# Patient Record
Sex: Male | Born: 1960
Health system: Southern US, Community
[De-identification: ages and names within clinical notes are randomized; demographics above are authoritative.]

## PROBLEM LIST (undated history)

## (undated) DIAGNOSIS — I10 Essential (primary) hypertension: Secondary | ICD-10-CM

## (undated) DIAGNOSIS — R066 Hiccough: Secondary | ICD-10-CM

## (undated) HISTORY — PX: OTHER SURGICAL HISTORY: SHX169

---

## 2005-05-29 ENCOUNTER — Ambulatory Visit (HOSPITAL_COMMUNITY): Admission: RE | Admit: 2005-05-29 | Discharge: 2005-05-29 | Payer: Self-pay | Admitting: Family Medicine

## 2005-10-10 ENCOUNTER — Emergency Department (HOSPITAL_COMMUNITY): Admission: EM | Admit: 2005-10-10 | Discharge: 2005-10-10 | Payer: Self-pay | Admitting: Emergency Medicine

## 2008-10-29 DIAGNOSIS — C4491 Basal cell carcinoma of skin, unspecified: Secondary | ICD-10-CM

## 2008-10-29 HISTORY — DX: Basal cell carcinoma of skin, unspecified: C44.91

## 2008-11-21 DIAGNOSIS — C4492 Squamous cell carcinoma of skin, unspecified: Secondary | ICD-10-CM

## 2008-11-21 HISTORY — DX: Squamous cell carcinoma of skin, unspecified: C44.92

## 2009-04-24 ENCOUNTER — Ambulatory Visit (HOSPITAL_COMMUNITY): Admission: RE | Admit: 2009-04-24 | Discharge: 2009-04-24 | Payer: Self-pay | Admitting: Family Medicine

## 2010-08-12 ENCOUNTER — Ambulatory Visit (HOSPITAL_COMMUNITY): Admission: RE | Admit: 2010-08-12 | Discharge: 2010-08-12 | Payer: Self-pay | Admitting: Family Medicine

## 2012-01-03 ENCOUNTER — Emergency Department (HOSPITAL_COMMUNITY)
Admission: EM | Admit: 2012-01-03 | Discharge: 2012-01-03 | Disposition: A | Discharge status: Home / Self Care | Payer: BC Managed Care – PPO | Attending: Emergency Medicine | Admitting: Emergency Medicine

## 2012-01-03 ENCOUNTER — Encounter (HOSPITAL_COMMUNITY): Payer: Self-pay | Admitting: *Deleted

## 2012-01-03 DIAGNOSIS — R066 Hiccough: Secondary | ICD-10-CM | POA: Insufficient documentation

## 2012-01-03 DIAGNOSIS — I10 Essential (primary) hypertension: Secondary | ICD-10-CM | POA: Insufficient documentation

## 2012-01-03 HISTORY — DX: Essential (primary) hypertension: I10

## 2012-01-03 MED ORDER — CHLORPROMAZINE HCL 25 MG/ML IJ SOLN
25.0000 mg | Freq: Once | INTRAMUSCULAR | Status: AC
  Administered 2012-01-03: 25 mg via INTRAMUSCULAR
  Filled 2012-01-03: qty 2

## 2012-01-03 MED ORDER — CHLORPROMAZINE HCL 25 MG PO TABS: 25.0000 mg | ORAL_TABLET | Freq: Three times a day (TID) | ORAL | Status: DC

## 2012-01-03 NOTE — ED Provider Notes (Signed)
History     CSN: [id number]  Arrival date & time 01/03/12  0121   First MD Initiated Contact with Patient 01/03/12 [phone]      Chief Complaint  Patient presents with  . Hiccups    (Consider location/radiation/quality/duration/timing/severity/associated sxs/prior treatment) HPI Comments: Hiccups for 3 days and not slowing.  Has had this problem before.  Was given a shot here which helped.  He has thorazine from 2009 which he kept in the freezer but is not helping.  No cough or shortness of breath.  No chest pain.  No other complaints.    The history is provided by the patient.    Past Medical History  Diagnosis Date  . Hypertension     History reviewed. No pertinent past surgical history.  History reviewed. No pertinent family history.  History  Substance Use Topics  . Smoking status: Not on file  . Smokeless tobacco: Not on file  . Alcohol Use:       Review of Systems  All other systems reviewed and are negative.    Allergies  Review of patient's allergies indicates not on file.  Home Medications   Current Outpatient Rx  Name Route Sig Dispense Refill  . CHLORAL HYDRATE 500 MG PO CAPS Oral Take 500 mg by mouth at bedtime as needed.    . ENALAPRIL MALEATE 10 MG PO TABS Oral Take 10 mg by mouth daily.    Marland Kitchen PRAVASTATIN SODIUM 40 MG PO TABS Oral Take 40 mg by mouth daily.      BP 153/99  Pulse 88  Temp(Src) 99.4 F (37.4 C) (Oral)  Resp 20  Ht 5\' 10"  (1.778 m)  Wt 208 lb (94.348 kg)  BMI 29.84 kg/m2  SpO2 96%  Physical Exam  Nursing note and vitals reviewed. Constitutional: He is oriented to person, place, and time. He appears well-developed and well-nourished. No distress.  HENT:  Head: Normocephalic and atraumatic.  Neck: Normal range of motion. Neck supple.  Cardiovascular: Normal rate.   No murmur heard. Pulmonary/Chest: Effort normal and breath sounds normal. No respiratory distress. He has no wheezes. He has no rales.  Abdominal: Soft. Bowel  sounds are normal.  Musculoskeletal: Normal range of motion.  Neurological: He is alert and oriented to person, place, and time.  Skin: Skin is warm and dry. He is not diaphoretic.    ED Course  Procedures (including critical care time)  Labs Reviewed - No data to display No results found.   No diagnosis found.    MDM  Feels better with thorazine.        Geoffery Lyons, MD 01/03/12 0300

## 2012-01-03 NOTE — Discharge Instructions (Signed)
Hiccups Hiccups are caused by a sudden contraction of the muscles between the ribs and the muscle under your lungs (diaphragm). When you hiccup, the top of your windpipe (glottis) closes immediately after your diaphragm contracts. This makes the typical 'hic' sound. A hiccup is a reflex that you cannot stop. Unlike other reflexes such as coughing and sneezing, hiccups do not seem to have any useful purpose. There are 3 types of hiccups:   Benign bouts: last less than 48 hours.   Persistent: last more than 48 hours but less than 1 month.   Intractable: last more than 1 month.  CAUSES  Most people have bouts of hiccups from time to time. They start for no apparent reason, last a short while, and then stop. Sometimes they are due to:  A temporary swollen stomach caused by overeating or eating too fast, eating spicy foods, drinking fizzy drinks, or swallowing air.   A sudden change in temperature (very hot or cold foods or drinks, a cold shower).   Drinking alcohol or using tobacco.  There are no particular tests used to diagnose hiccups. Hiccups are usually considered harmless and do not point to a serious medical condition. However, there can be underlying medical problems that may cause hiccups, such as pneumonia, diabetes, metabolic problems, tumors, abdominal infections or injuries, and neurologic problems.You must follow up with your caregiver if your symptoms persist or become a frequent problem. TREATMENT   Most cases need no treatment. A bout of benign hiccups usually does not last long.   Medicine is sometimes needed to stop persistent hiccups. Medicine may be given intravenously (IV) or by mouth.   Hypnosis or acupuncture may be suggested.   Surgery to affect the nerve that supplies the diaphragm may be tried in severe cases.   Treatment of an underlying cause is needed in some cases.  HOME CARE INSTRUCTIONS  Popular remedies that may stop a bout of hiccups include:  Gargling  ice water.   Swallowing granulated sugar.   Biting on a lemon.   Holding your breath, breathing fast, or breathing into a paper bag.   Bearing down.   Gasping after a sudden fright.   Pulling your tongue gently.   Distraction.  SEEK MEDICAL CARE IF:   Hiccups last for more than 48 hours.   You are given medicine but your hiccups do not get better.   New symptoms show up.   You cannot sleep or eat due to the hiccups.   You have unexpected weight loss.   You have trouble breathing or swallowing.   You develop severe pain in your abdomen or other areas.   You develop numbness, tingling, or weakness.  Document Released: 11/23/2001 Document Revised: 09/03/2011 Document Reviewed: 11/05/2010 [facility] Patient Information 2012 [place], [place].

## 2012-03-03 DIAGNOSIS — C4491 Basal cell carcinoma of skin, unspecified: Secondary | ICD-10-CM

## 2012-03-03 HISTORY — DX: Basal cell carcinoma of skin, unspecified: C44.91

## 2012-07-06 DIAGNOSIS — C4491 Basal cell carcinoma of skin, unspecified: Secondary | ICD-10-CM

## 2012-07-06 HISTORY — DX: Basal cell carcinoma of skin, unspecified: C44.91

## 2014-01-25 ENCOUNTER — Emergency Department (HOSPITAL_COMMUNITY): Payer: BC Managed Care – PPO

## 2014-01-25 ENCOUNTER — Emergency Department (HOSPITAL_COMMUNITY)
Admission: EM | Admit: 2014-01-25 | Discharge: 2014-01-26 | Disposition: A | Discharge status: Home / Self Care | Payer: BC Managed Care – PPO | Attending: Emergency Medicine | Admitting: Emergency Medicine

## 2014-01-25 ENCOUNTER — Encounter (HOSPITAL_COMMUNITY): Payer: Self-pay | Admitting: Emergency Medicine

## 2014-01-25 DIAGNOSIS — J029 Acute pharyngitis, unspecified: Secondary | ICD-10-CM | POA: Insufficient documentation

## 2014-01-25 DIAGNOSIS — I10 Essential (primary) hypertension: Secondary | ICD-10-CM

## 2014-01-25 DIAGNOSIS — J209 Acute bronchitis, unspecified: Secondary | ICD-10-CM | POA: Insufficient documentation

## 2014-01-25 DIAGNOSIS — J40 Bronchitis, not specified as acute or chronic: Secondary | ICD-10-CM

## 2014-01-25 NOTE — Discharge Instructions (Signed)
Drink plenty of fluids. Take the antibiotic until gone. Start taking the blood pressure medications again. Try to get a primary care doctor so you can stay on your blood pressure medication. Return if you feel worse such as fever, headache, chest pain, shortness of breath.

## 2014-01-25 NOTE — ED Provider Notes (Signed)
CSN: [id number]     Arrival date & time 01/25/14  2101 History  This chart was scribed for Janice Norrie, MD by Ladene Artist, ED Scribe. The patient was seen in room APA08/APA08. Patient's care was started at 10:40 PM.    Chief Complaint  Patient presents with  . Hypertension  . Sore Throat    Patient is a 53 y.o. male presenting with pharyngitis. The history is provided by the patient. No language interpreter was used.  Sore Throat Pertinent negatives include no chest pain, no headaches and no shortness of breath.   HPI Comments: Ethan Walls is a 53 y.o. male who presents to the Emergency Department complaining of sore throat for 1.5-2 weeks. Pt states that he has been having cold-like symptoms for a about 3  weeks. He reports associated mildly productive cough with yellow-green sputum and spasms in his jaw that moves from side to side.. Pt reports diffuse teeth pain which is alleviated with biting down. He also states that the roof of his mouth has felt ike sand paper since Monday, 3 days ago. He also reports congestion, neck soreness and sinus pressure every 15 minutes. He denies fever, wheezing, SOB, chest pain, HA, nausea, vomiting, diarrhea, rhinorrhea, and sneezing. Pt used Chloraseptic spray and lozenge Tuesday with improvement of his sore throat.   No sick contacts.  Pt is a nonsmoker. His parent's were smokers.   Pt worked 12 hours today.   PCP Karie Kirks; last appointment was 06/2013 but he was not seen since he left due to long wait.    Past Medical History  Diagnosis Date  . Hypertension    History reviewed. No pertinent past surgical history. No family history on file. History  Substance Use Topics  . Smoking status: Never Smoker   . Smokeless tobacco: Not on file  . Alcohol Use: No  employed Lives at home Lives with spouse FOP used to smoke  Review of Systems  Constitutional: Negative for fever.  HENT: Positive for congestion, dental problem (teeth pain)  and sinus pressure. Negative for rhinorrhea and sneezing.   Respiratory: Positive for cough. Negative for shortness of breath and wheezing.   Cardiovascular: Negative for chest pain.  Gastrointestinal: Negative for nausea, vomiting and diarrhea.  Neurological: Negative for headaches.  All other systems reviewed and are negative.   Allergies  Review of patient's allergies indicates no known allergies.  Home Medications   Pt states that he used to take 10mg  enalapril, a cholesterol pill and ASA 81 mg daily.  Triage Vitals: BP 187/104  Pulse 71  Temp(Src) 98.1 F (36.7 C) (Oral)  Resp 18  SpO2 98%  Vital signs normal except hypertension  Physical Exam  Nursing note and vitals reviewed. Constitutional: He is oriented to person, place, and time. He appears well-developed and well-nourished.  Non-toxic appearance. He does not appear ill. No distress.  HENT:  Head: Normocephalic and atraumatic.  Right Ear: External ear normal.  Left Ear: External ear normal.  Nose: Nose normal. No mucosal edema or rhinorrhea.  Mouth/Throat: Oropharynx is clear and moist and mucous membranes are normal. No dental abscesses or uvula swelling.  Percussed pt's teeth, nontender  Eyes: Conjunctivae and EOM are normal. Pupils are equal, round, and reactive to light.  Neck: Normal range of motion and full passive range of motion without pain. Neck supple.  Cardiovascular: Normal rate, regular rhythm and normal heart sounds.  Exam reveals no gallop and no friction rub.   No murmur heard.  Pulmonary/Chest: Effort normal and breath sounds normal. No respiratory distress. He has no wheezes. He has no rhonchi. He has no rales. He exhibits no tenderness and no crepitus.  Lungs slightly diminished   Abdominal: Soft. Normal appearance and bowel sounds are normal. He exhibits no distension. There is no tenderness. There is no rebound and no guarding.  Musculoskeletal: Normal range of motion. He exhibits no edema and  no tenderness.  Moves all extremities well.   Neurological: He is alert and oriented to person, place, and time. He has normal strength. No cranial nerve deficit.  Skin: Skin is warm, dry and intact. No rash noted. No erythema. No pallor.  Psychiatric: He has a normal mood and affect. His speech is normal and behavior is normal. His mood appears not anxious.    ED Course  Procedures (including critical care time) DIAGNOSTIC STUDIES: Oxygen Saturation is 98% on RA, normal by my interpretation.    COORDINATION OF CARE: 10:51 PM-Discussed treatment plan which includes CXR with pt at bedside and pt agreed to plan.   Labs Review Labs Reviewed - No data to display  Imaging Review Dg Chest 2 View  01/26/2014   CLINICAL DATA:  Cough and sore throat  EXAM: CHEST  2 VIEW  COMPARISON:  Prior radiograph from 08/12/2010  FINDINGS: The cardiac and mediastinal silhouettes are stable in size and contour, and remain within normal limits.  The lungs are normally inflated. No airspace consolidation, pleural effusion, or pulmonary edema is identified. There is no pneumothorax.  No acute osseous abnormality identified.  IMPRESSION: No acute cardiopulmonary abnormality identified.   Electronically Signed   By: Jeannine Boga M.D.   On: 01/26/2014 00:14     EKG Interpretation None      MDM   Final diagnoses:  Bronchitis  Sore throat  Hypertension   New Prescriptions   AMOXICILLIN (AMOXIL) 500 MG CAPSULE    Take 1 capsule (500 mg total) by mouth 3 (three) times daily.   ENALAPRIL (VASOTEC) 10 MG TABLET    Take 0.5 tablets (5 mg total) by mouth daily.    Plan discharge   Rolland Porter, MD, FACEP   I personally performed the services described in this documentation, which was scribed in my presence. The recorded information has been reviewed and considered.  Rolland Porter, MD, Abram Sander    Janice Norrie, MD 01/26/14 [phone]

## 2014-01-25 NOTE — ED Notes (Signed)
Pt reports he went to urgent care for cold symptoms, and was sent to the ED due to high BP 204/132. Pt c/o sore throat, mouth. Reports coughing "some phlegm up but it's not moving".

## 2014-01-25 NOTE — ED Notes (Signed)
Pt reports "i had a falling out with my doctor about a year ago and I haven't gotten my medications refilled since then."

## 2014-01-26 MED ORDER — ENALAPRIL MALEATE 10 MG PO TABS: 5.0000 mg | ORAL_TABLET | Freq: Every day | ORAL | Status: DC

## 2014-01-26 MED ORDER — AMOXICILLIN 500 MG PO CAPS: 500.0000 mg | ORAL_CAPSULE | Freq: Three times a day (TID) | ORAL | Status: DC

## 2014-02-15 ENCOUNTER — Encounter: Payer: Self-pay | Admitting: Family Medicine

## 2014-02-15 ENCOUNTER — Ambulatory Visit (INDEPENDENT_AMBULATORY_CARE_PROVIDER_SITE_OTHER): Payer: BC Managed Care – PPO | Admitting: Family Medicine

## 2014-02-15 VITALS — BP 158/94 | Ht 70.0 in | Wt 212.0 lb

## 2014-02-15 DIAGNOSIS — I10 Essential (primary) hypertension: Secondary | ICD-10-CM

## 2014-02-15 DIAGNOSIS — Z13220 Encounter for screening for lipoid disorders: Secondary | ICD-10-CM

## 2014-02-15 DIAGNOSIS — Z79899 Other long term (current) drug therapy: Secondary | ICD-10-CM

## 2014-02-15 DIAGNOSIS — Z125 Encounter for screening for malignant neoplasm of prostate: Secondary | ICD-10-CM

## 2014-02-15 DIAGNOSIS — E785 Hyperlipidemia, unspecified: Secondary | ICD-10-CM

## 2014-02-15 LAB — HEPATIC FUNCTION PANEL
ALBUMIN: 4.4 g/dL (ref 3.5–5.2)
ALT: 33 U/L (ref 0–53)
AST: 26 U/L (ref 0–37)
Alkaline Phosphatase: 71 U/L (ref 39–117)
Bilirubin, Direct: 0.1 mg/dL (ref 0.0–0.3)
Indirect Bilirubin: 0.6 mg/dL (ref 0.2–1.2)
TOTAL PROTEIN: 7.3 g/dL (ref 6.0–8.3)
Total Bilirubin: 0.7 mg/dL (ref 0.2–1.2)

## 2014-02-15 LAB — LIPID PANEL
CHOL/HDL RATIO: 4.4 ratio
Cholesterol: 191 mg/dL (ref 0–200)
HDL: 43 mg/dL (ref >39)
LDL CALC: 127 mg/dL — AB (ref 0–99)
TRIGLYCERIDES: 106 mg/dL (ref <150)
VLDL: 21 mg/dL (ref 0–40)

## 2014-02-15 LAB — BASIC METABOLIC PANEL
BUN: 25 mg/dL — ABNORMAL HIGH (ref 6–23)
CHLORIDE: 103 meq/L (ref 96–112)
CO2: 28 mEq/L (ref 19–32)
CREATININE: 1.22 mg/dL (ref 0.50–1.35)
Calcium: 9.8 mg/dL (ref 8.4–10.5)
Glucose, Bld: 95 mg/dL (ref 70–99)
POTASSIUM: 4.7 meq/L (ref 3.5–5.3)
SODIUM: 140 meq/L (ref 135–145)

## 2014-02-15 MED ORDER — ENALAPRIL MALEATE 10 MG PO TABS: 10.0000 mg | ORAL_TABLET | Freq: Every day | ORAL | Status: DC

## 2014-02-15 MED ORDER — CEFPROZIL 500 MG PO TABS: 500.0000 mg | ORAL_TABLET | Freq: Two times a day (BID) | ORAL | Status: DC

## 2014-02-15 NOTE — Progress Notes (Signed)
   Subjective:    Patient ID: Ethan Walls, male    DOB: 1961/03/27, 53 y.o.   MRN: [id number]  HPI This is a new patient. Wife and daughter come here.  He used to go to Dr. Karie Kirks.  Pt needs a refill on his BP med. Hx of htn and on meds for ten yrs.  Hx of too high dose in the past, and the med was cut in half.  BP recently 150 or 90/  Took chol meds for about eight yrs. Off since oct 13.  Came off the cho med, describes diet as so so   On the move, gets exercise at work, eats b fast daily    Hadn't been back to the doc since. Had a chest cold and got abx several weeks ago. At that time bP was thru the roof, 204 over 132 in that vist. Pt declined rec to go to the ER. Pt would like to know if he needs to take his cholesterol med.   Works at [employer], on the [employer], with them for 11 yrs, staying busy.  Took a round of amox for the bronchial infxn.  Still having a productive cough,     Review of Systems No chest pain no headache no fever no abdominal pain no change in bowel habits no blood in stool ROS otherwise negative    Objective:   Physical Exam Alert slight malaise. Blood pressure 142/90 on repeat. Vital stable. HEENT moderate nasal congestion lungs rare rhonchi no wheezes heart rare rhythm. Ankles edema.       Assessment & Plan:  Pression 1 hypertension suboptimal in control discussed. #2 hyperlipidemia status uncertain. #3 persistent bronchitis post round of amoxicillin plan appropriate blood work. Cefzil twice a day 10 days. Diet exercise discussed. Increase enalapril to 10 mg daily. Await results of blood work. WSL

## 2014-02-16 LAB — PSA: PSA: 0.21 ng/mL (ref <=4.00)

## 2014-02-18 DIAGNOSIS — E785 Hyperlipidemia, unspecified: Secondary | ICD-10-CM | POA: Insufficient documentation

## 2014-02-18 DIAGNOSIS — I10 Essential (primary) hypertension: Secondary | ICD-10-CM | POA: Insufficient documentation

## 2014-02-22 NOTE — Progress Notes (Signed)
Patient notified and verbalized understanding of the test results. No further questions. 

## 2014-03-20 ENCOUNTER — Ambulatory Visit (INDEPENDENT_AMBULATORY_CARE_PROVIDER_SITE_OTHER): Payer: BC Managed Care – PPO | Admitting: Family Medicine

## 2014-03-20 ENCOUNTER — Encounter: Payer: Self-pay | Admitting: Family Medicine

## 2014-03-20 VITALS — BP 130/84 | Ht 68.25 in | Wt 209.4 lb

## 2014-03-20 DIAGNOSIS — Z0000 Encounter for general adult medical examination without abnormal findings: Secondary | ICD-10-CM

## 2014-03-20 MED ORDER — ENALAPRIL MALEATE 10 MG PO TABS: 10.0000 mg | ORAL_TABLET | Freq: Every day | ORAL | Status: DC

## 2014-03-20 MED ORDER — TRIAMCINOLONE ACETONIDE 0.1 % EX CREA: 1.0000 "application " | TOPICAL_CREAM | Freq: Two times a day (BID) | CUTANEOUS | Status: DC

## 2014-03-20 NOTE — Progress Notes (Signed)
Subjective:    Patient ID: Ethan Walls, male    DOB: 12/30/1960, 53 y.o.   MRN: [id number]  HPI The patient comes in today for a wellness visit.  Exercise going ok, staying busy  Grades diet about a five Results for orders placed in visit on 02/15/14  LIPID PANEL      Result Value Ref Range   Cholesterol 191  0 - 200 mg/dL   Triglycerides 106  <150 mg/dL   HDL 43  >39 mg/dL   Total CHOL/HDL Ratio 4.4     VLDL 21  0 - 40 mg/dL   LDL Cholesterol 127 (*) 0 - 99 mg/dL  HEPATIC FUNCTION PANEL      Result Value Ref Range   Total Bilirubin 0.7  0.2 - 1.2 mg/dL   Bilirubin, Direct 0.1  0.0 - 0.3 mg/dL   Indirect Bilirubin 0.6  0.2 - 1.2 mg/dL   Alkaline Phosphatase 71  39 - 117 U/L   AST 26  0 - 37 U/L   ALT 33  0 - 53 U/L   Total Protein 7.3  6.0 - 8.3 g/dL   Albumin 4.4  3.5 - 5.2 g/dL  BASIC METABOLIC PANEL      Result Value Ref Range   Sodium 140  135 - 145 mEq/L   Potassium 4.7  3.5 - 5.3 mEq/L   Chloride 103  96 - 112 mEq/L   CO2 28  19 - 32 mEq/L   Glucose, Bld 95  70 - 99 mg/dL   BUN 25 (*) 6 - 23 mg/dL   Creat 1.22  0.50 - 1.35 mg/dL   Calcium 9.8  8.4 - 10.5 mg/dL  PSA      Result Value Ref Range   PSA 0.21  <=4.00 ng/mL     A review of their health history was completed.  A review of medications was also completed.  Any needed refills; yes  Eating habits: good  Falls/  MVA accidents in past few months: no  Regular exercise: yes  Specialist pt sees on regular basis: no  Preventative health issues were discussed.   Additional concerns: Rash on chest and feet that has been present for about 1 week now. Recently had itch y rash cropped up on ant chest and feet. Wears a boot and sweats at work    Has not had colonoscopy  No fam hx of colon ca   Review of Systems  Constitutional: Negative for fever, activity change and appetite change.  HENT: Negative for congestion and rhinorrhea.   Eyes: Negative for discharge.  Respiratory: Negative for  cough and wheezing.   Cardiovascular: Negative for chest pain.  Gastrointestinal: Negative for vomiting, abdominal pain and blood in stool.  Genitourinary: Negative for frequency and difficulty urinating.  Musculoskeletal: Negative for neck pain.  Skin: Negative for rash.  Allergic/Immunologic: Negative for environmental allergies and food allergies.  Neurological: Negative for weakness and headaches.  Psychiatric/Behavioral: Negative for agitation.  All other systems reviewed and are negative.      Objective:   Physical Exam  Vitals reviewed. Constitutional: He appears well-developed and well-nourished.  HENT:  Head: Normocephalic and atraumatic.  Right Ear: External ear normal.  Left Ear: External ear normal.  Nose: Nose normal.  Mouth/Throat: Oropharynx is clear and moist.  Eyes: EOM are normal. Pupils are equal, round, and reactive to light.  Neck: Normal range of motion. Neck supple. No thyromegaly present.  Cardiovascular: Normal rate, regular rhythm and normal  heart sounds.   No murmur heard. Pulmonary/Chest: Effort normal and breath sounds normal. No respiratory distress. He has no wheezes.  Abdominal: Soft. Bowel sounds are normal. He exhibits no distension and no mass. There is no tenderness.  Genitourinary: Penis normal.  Prostate within normal limits  Musculoskeletal: Normal range of motion. He exhibits no edema.  Lymphadenopathy:    He has no cervical adenopathy.  Neurological: He is alert. He exhibits normal muscle tone.  Skin: Skin is warm and dry. No erythema.  Psychiatric: He has a normal mood and affect. His behavior is normal. Judgment normal.          Assessment & Plan:  Impression 1 wellness exam. #2 hypertension decent control. #3 hyperlipidemia and discuss plan encouraged to get colonoscopy. GI sheet given. Maintain same meds. Diet exercise discussed. Recheck every 6 months.

## 2014-03-22 ENCOUNTER — Encounter: Payer: Self-pay | Admitting: Family Medicine

## 2014-03-28 ENCOUNTER — Encounter (HOSPITAL_COMMUNITY): Payer: Self-pay | Admitting: Emergency Medicine

## 2014-03-28 ENCOUNTER — Emergency Department (HOSPITAL_COMMUNITY)
Admission: EM | Admit: 2014-03-28 | Discharge: 2014-03-28 | Disposition: A | Discharge status: Home / Self Care | Payer: BC Managed Care – PPO | Attending: Emergency Medicine | Admitting: Emergency Medicine

## 2014-03-28 DIAGNOSIS — IMO0002 Reserved for concepts with insufficient information to code with codable children: Secondary | ICD-10-CM | POA: Insufficient documentation

## 2014-03-28 DIAGNOSIS — Z79899 Other long term (current) drug therapy: Secondary | ICD-10-CM | POA: Insufficient documentation

## 2014-03-28 DIAGNOSIS — I10 Essential (primary) hypertension: Secondary | ICD-10-CM | POA: Insufficient documentation

## 2014-03-28 NOTE — ED Provider Notes (Signed)
This chart was scribed for [place], DO by Lowella Petties, ED Scribe. The patient was seen in room APA09/APA09. Patient's care was started at 9:18 PM.  CHIEF COMPLAINT: HTN  HPI: HPI Comments: Ethan Walls is a 53 y.o. male history of hypertension on 10 mg of enalapril once daily who presents to the Emergency Department complaining of HTN.He reports that his BP was 191/116. His states that his PCP was unable to see him today, so he came to the ED. He states that he has an associated, constant, mild headache for which he rates the pain a 2/10. He reports taking Tylenol with relief.  He states his headache is now gone. He denies any chest pain or shortness of breath. No numbness, tingling or focal weakness. No vision changes. He states that his blood pressure has been elevated for the last several days here in the ED his blood pressure is 145/88. He is completely asymptomatic.  PCP: Rubbie Battiest, MD   ROS: See HPI Constitutional: no fever  Eyes: no drainage  ENT: no runny nose   Cardiovascular:  no chest pain  Resp: no SOB  GI: no vomiting GU: no dysuria Integumentary: no rash  Allergy: no hives  Musculoskeletal: no leg swelling  Neurological: no slurred speech ROS otherwise negative  PAST MEDICAL HISTORY/PAST SURGICAL HISTORY:  Past Medical History  Diagnosis Date  . Hypertension     MEDICATIONS:  Prior to Admission medications   Medication Sig Start Date End Date Taking? Authorizing Provider  enalapril (VASOTEC) 10 MG tablet Take 1 tablet (10 mg total) by mouth daily. 03/20/14  Yes Mikey Kirschner, MD  triamcinolone cream (KENALOG) 0.1 % Apply 1 application topically 2 (two) times daily. 03/20/14   Mikey Kirschner, MD    ALLERGIES:  No Known Allergies  SOCIAL HISTORY:  History  Substance Use Topics  . Smoking status: Never Smoker   . Smokeless tobacco: Not on file  . Alcohol Use: No    FAMILY HISTORY: No family history on file.  EXAM: Triage Vitals: BP  151/90  Pulse 75  Temp(Src) 97.9 F (36.6 C) (Oral)  Resp 20  Ht 5\' 8"  (1.727 m)  Wt 209 lb (94.802 kg)  BMI 31.79 kg/m2  SpO2 97% CONSTITUTIONAL: Alert and oriented and responds appropriately to questions. Well-appearing; well-nourished HEAD: Normocephalic EYES: Conjunctivae clear, PERRL ENT: normal nose; no rhinorrhea; moist mucous membranes; pharynx without lesions noted NECK: Supple, no meningismus, no LAD  CARD: RRR; S1 and S2 appreciated; no murmurs, no clicks, no rubs, no gallops RESP: Normal chest excursion without splinting or tachypnea; breath sounds clear and equal bilaterally; no wheezes, no rhonchi, no rales,  ABD/GI: Normal bowel sounds; non-distended; soft, non-tender, no rebound, no guarding BACK:  The back appears normal and is non-tender to palpation, there is no CVA tenderness EXT: Normal ROM in all joints; non-tender to palpation; no edema; normal capillary refill; no cyanosis    SKIN: Normal color for age and race; warm NEURO: Moves all extremities equally, sensation to light touch intact diffusely, cranial nerves II through XII intact, normal gait PSYCH: The patient's mood and manner are appropriate. Grooming and personal hygiene are appropriate.  MEDICAL DECISION MAKING: Patient here with asymptomatic hypertension that has resolved. His blood pressure here is 143/88. He denies any current complaints. Have discussed with patient that I do not feel he needs any further intervention at this time. He states he has normal labs drawn in 4 weeks ago. Have discussed  with patient that I recommend that he check his blood pressure when he is feeling well and keep a log of this and followup with his primary care physician. Have discussed with him that if he has a headache that he should take Tylenol or ibuprofen and if his headache persists after 30 minutes, check his blood pressure. Have discussed strict return precautions. Have discussed supportive care instructions. He verbalizes  understanding and is comfortable with this plan.  I personally performed the services described in this documentation, which was scribed in my presence. The recorded information has been reviewed and is accurate.      [place], DO 03/29/14 [phone]

## 2014-03-28 NOTE — Discharge Instructions (Signed)
Hypertension Hypertension, commonly called high blood pressure, is when the force of blood pumping through your arteries is too strong. Your arteries are the blood vessels that carry blood from your heart throughout your body. A blood pressure reading consists of a higher number over a lower number, such as 110/72. The higher number (systolic) is the pressure inside your arteries when your heart pumps. The lower number (diastolic) is the pressure inside your arteries when your heart relaxes. Ideally you want your blood pressure below 120/80. Hypertension forces your heart to work harder to pump blood. Your arteries may become narrow or stiff. Having hypertension puts you at risk for heart disease, stroke, and other problems.  RISK FACTORS Some risk factors for high blood pressure are controllable. Others are not.  Risk factors you cannot control include:   Race. You may be at higher risk if you are African American.  Age. Risk increases with age.  Gender. Men are at higher risk than women before age 45 years. After age 65, women are at higher risk than men. Risk factors you can control include:  Not getting enough exercise or physical activity.  Being overweight.  Getting too much fat, sugar, calories, or salt in your diet.  Drinking too much alcohol. SIGNS AND SYMPTOMS Hypertension does not usually cause signs or symptoms. Extremely high blood pressure (hypertensive crisis) may cause headache, anxiety, shortness of breath, and nosebleed. DIAGNOSIS  To check if you have hypertension, your health care provider will measure your blood pressure while you are seated, with your arm held at the level of your heart. It should be measured at least twice using the same arm. Certain conditions can cause a difference in blood pressure between your right and left arms. A blood pressure reading that is higher than normal on one occasion does not mean that you need treatment. If one blood pressure reading  is high, ask your health care provider about having it checked again. TREATMENT  Treating high blood pressure includes making lifestyle changes and possibly taking medication. Living a healthy lifestyle can help lower high blood pressure. You may need to change some of your habits. Lifestyle changes may include:  Following the DASH diet. This diet is high in fruits, vegetables, and whole grains. It is low in salt, red meat, and added sugars.  Getting at least 2 1/2 hours of brisk physical activity every week.  Losing weight if necessary.  Not smoking.  Limiting alcoholic beverages.  Learning ways to reduce stress. If lifestyle changes are not enough to get your blood pressure under control, your health care provider may prescribe medicine. You may need to take more than one. Work closely with your health care provider to understand the risks and benefits. HOME CARE INSTRUCTIONS  Have your blood pressure rechecked as directed by your health care provider.   Only take medicine as directed by your health care provider. Follow the directions carefully. Blood pressure medicines must be taken as prescribed. The medicine does not work as well when you skip doses. Skipping doses also puts you at risk for problems.   Do not smoke.   Monitor your blood pressure at home as directed by your health care provider. SEEK MEDICAL CARE IF:   You think you are having a reaction to medicines taken.  You have recurrent headaches or feel dizzy.  You have swelling in your ankles.  You have trouble with your vision. SEEK IMMEDIATE MEDICAL CARE IF:  You develop a severe headache or   confusion.  You have unusual weakness, numbness, or feel faint.  You have severe chest or abdominal pain.  You vomit repeatedly.  You have trouble breathing. MAKE SURE YOU:   Understand these instructions.  Will watch your condition.  Will get help right away if you are not doing well or get  worse. Document Released: 09/14/2005 Document Revised: 09/19/2013 Document Reviewed: 07/07/2013 Exit[employer] Patient Information 2015 [employer]. This information is not intended to replace advice given to you by your health care provider. Make sure you discuss any questions you have with your health care provider.  

## 2014-03-28 NOTE — ED Notes (Signed)
High blood pressure since yesterday per pt. 160/101 yesterday; today blood pressure has been 191/124 this morning; 142/90 at 1820 at home.

## 2014-04-28 ENCOUNTER — Encounter (HOSPITAL_COMMUNITY): Payer: Self-pay | Admitting: Emergency Medicine

## 2014-04-28 ENCOUNTER — Emergency Department (HOSPITAL_COMMUNITY)
Admission: EM | Admit: 2014-04-28 | Discharge: 2014-04-29 | Disposition: A | Discharge status: Home / Self Care | Payer: BC Managed Care – PPO | Attending: Emergency Medicine | Admitting: Emergency Medicine

## 2014-04-28 DIAGNOSIS — Z79899 Other long term (current) drug therapy: Secondary | ICD-10-CM | POA: Insufficient documentation

## 2014-04-28 DIAGNOSIS — K089 Disorder of teeth and supporting structures, unspecified: Secondary | ICD-10-CM | POA: Insufficient documentation

## 2014-04-28 DIAGNOSIS — IMO0002 Reserved for concepts with insufficient information to code with codable children: Secondary | ICD-10-CM | POA: Insufficient documentation

## 2014-04-28 DIAGNOSIS — R509 Fever, unspecified: Secondary | ICD-10-CM | POA: Insufficient documentation

## 2014-04-28 DIAGNOSIS — I10 Essential (primary) hypertension: Secondary | ICD-10-CM | POA: Insufficient documentation

## 2014-04-28 DIAGNOSIS — K047 Periapical abscess without sinus: Secondary | ICD-10-CM | POA: Insufficient documentation

## 2014-04-28 MED ORDER — OXYCODONE-ACETAMINOPHEN 5-325 MG PO TABS
1.0000 | ORAL_TABLET | Freq: Once | ORAL | Status: AC
  Administered 2014-04-28: 1 via ORAL
  Filled 2014-04-28: qty 1

## 2014-04-28 MED ORDER — PENICILLIN V POTASSIUM 500 MG PO TABS: 500.0000 mg | ORAL_TABLET | Freq: Four times a day (QID) | ORAL | Status: AC

## 2014-04-28 MED ORDER — PENICILLIN V POTASSIUM 250 MG PO TABS
500.0000 mg | ORAL_TABLET | Freq: Once | ORAL | Status: AC
  Administered 2014-04-28: 500 mg via ORAL
  Filled 2014-04-28: qty 2

## 2014-04-28 MED ORDER — OXYCODONE-ACETAMINOPHEN 5-325 MG PO TABS: 1.0000 | ORAL_TABLET | ORAL | Status: DC | PRN

## 2014-04-28 NOTE — ED Notes (Signed)
Pt reports "I have an abscessed tooth, I believe."  Pt reporting intermitent fever, and pain for aprox 1 week.

## 2014-04-28 NOTE — ED Provider Notes (Signed)
CSN: [id number]     Arrival date & time 04/28/14  2220 History  This chart was scribed for Sharyon Cable, MD by Girtha Hake, ED Scribe. The patient was seen in [place]. The patient's care was started at 11:45 PM.   Chief Complaint  Patient presents with  . Dental Pain   Patient is a 53 y.o. male presenting with tooth pain. The history is provided by the patient. No language interpreter was used.  Dental Pain Associated symptoms: facial swelling and fever    HPI Comments: JAQUALYN JUDAY is a 53 y.o. male who presents to the Emergency Department complaining of an abscess on his right lower jaw. Patient states that he first thad problems with this tooth two months ago, but it became more severe in recent days. He reports associated pain, swelling, fever, and chills. Patient states that the fever was 100.2 at its peak. He denies vomiting or dysphagia. Patient last took Tylenol two hours ago with some relief of the fever and pain. Patient has no known allergies.  PCP is Dr. Wolfgang Phoenix.  Past Medical History  Diagnosis Date  . Hypertension    History reviewed. No pertinent past surgical history. History reviewed. No pertinent family history. History  Substance Use Topics  . Smoking status: Never Smoker   . Smokeless tobacco: Not on file  . Alcohol Use: No    Review of Systems  Constitutional: Positive for fever and chills.  HENT: Positive for dental problem and facial swelling.   Gastrointestinal: Negative for vomiting.      Allergies  Review of patient's allergies indicates no known allergies.  Home Medications   Prior to Admission medications   Medication Sig Start Date End Date Taking? Authorizing Provider  enalapril (VASOTEC) 10 MG tablet Take 1 tablet (10 mg total) by mouth daily. 03/20/14   Mikey Kirschner, MD  triamcinolone cream (KENALOG) 0.1 % Apply 1 application topically 2 (two) times daily. 03/20/14   Mikey Kirschner, MD   Triage Vitals: BP 182/104  Pulse  110  Temp(Src) 99.7 F (37.6 C) (Oral)  Resp 18  Ht 5' 9.5" (1.765 m)  Wt 208 lb (94.348 kg)  BMI 30.29 kg/m2  SpO2 96% Physical Exam CONSTITUTIONAL: Well developed/well nourished HEAD: Normocephalic/atraumatic EYES: EOMI/PERRL ENMT: Mucous membranes moist, abscess noted to right lower teeth (27/28) that is actively draining pus, no trismus There is mild external swelling noted to right mandible.  No other facial swelling noted NECK: supple no meningeal signs, anterior neck soft, no crepitus SPINE:entire spine nontender CV: S1/S2 noted, no murmurs/rubs/gallops noted LUNGS: Lungs are clear to auscultation bilaterally, no apparent distress ABDOMEN: soft, nontender, no rebound or guarding NEURO: Pt is awake/alert, moves all extremitiesx4 EXTREMITIES: pulses normal, full ROM SKIN: warm, color normal PSYCH: no abnormalities of mood noted  ED Course  Procedures  DIAGNOSTIC STUDIES: Oxygen Saturation is 96% on room air, normal by my interpretation.    COORDINATION OF CARE: 11:55 PM-Discussed treatment plan which includes Percocet and penicillin with pt at bedside and pt agreed to plan.  Advised patient about precautions for return.   Pt with dental abscess that is spontaneously draining No need for surgical drainage here Pt is not septic appearing, further imaging/labs not warranted He is handling secretions, no drooling/stridor noted Started on antibiotics and referred to dentistry    MDM   Final diagnoses:  Dental abscess    I personally performed the services described in this documentation, which was scribed in my presence.  The recorded information has been reviewed and is accurate. Sharyon Cable, MD 04/29/14 [phone]

## 2014-04-28 NOTE — Discharge Instructions (Signed)

## 2014-09-17 ENCOUNTER — Ambulatory Visit (INDEPENDENT_AMBULATORY_CARE_PROVIDER_SITE_OTHER): Payer: BC Managed Care – PPO | Admitting: Family Medicine

## 2014-09-17 ENCOUNTER — Encounter: Payer: Self-pay | Admitting: Family Medicine

## 2014-09-17 VITALS — BP 134/90 | Ht 68.25 in | Wt 210.0 lb

## 2014-09-17 DIAGNOSIS — E785 Hyperlipidemia, unspecified: Secondary | ICD-10-CM

## 2014-09-17 DIAGNOSIS — I10 Essential (primary) hypertension: Secondary | ICD-10-CM

## 2014-09-17 MED ORDER — ENALAPRIL MALEATE 10 MG PO TABS: 10.0000 mg | ORAL_TABLET | Freq: Every day | ORAL | Status: DC

## 2014-09-17 NOTE — Progress Notes (Signed)
   Subjective:    Patient ID: Ethan Walls, male    DOB: 02/21/1961, 53 y.o.   MRN: [id number]  Hypertension This is a chronic problem. The current episode started more than 1 year ago. The problem has been gradually improving since onset. The problem is controlled. There are no associated agents to hypertension. There are no known risk factors for coronary artery disease. Treatments tried: enalapril. The current treatment provides significant improvement. There are no compliance problems.    Patient states that he has had pain in his left thigh/groin area for about 2 weeks now.    Pain top of leg and extending to the groin. Worse with rotation of the hip, Pain difficulty Missed vasotec yest morn   Review of Systems No headache no chest pain no back pain no abdominal pain    Objective:   Physical Exam Alert no acute distress. Vitals stable blood pressure 132/88. Lungs clear. Heart regular in rhythm. Left leg good range of motion. Some pain to deep palpation and groin and anterior thigh and with internal flexion       Assessment & Plan:  Pressure 1 hypertension recently good control #2 muscle skeletal leg pain plan trial of anti-inflammatories. Maintain blood pressure medicine. Diet exercise discussed recheck in 6 months. WSL

## 2015-03-19 ENCOUNTER — Ambulatory Visit (INDEPENDENT_AMBULATORY_CARE_PROVIDER_SITE_OTHER): Payer: BLUE CROSS/BLUE SHIELD | Admitting: Family Medicine

## 2015-03-19 ENCOUNTER — Encounter: Payer: Self-pay | Admitting: Family Medicine

## 2015-03-19 VITALS — BP 120/88 | Ht 68.25 in | Wt 208.5 lb

## 2015-03-19 DIAGNOSIS — Z125 Encounter for screening for malignant neoplasm of prostate: Secondary | ICD-10-CM

## 2015-03-19 DIAGNOSIS — Z79899 Other long term (current) drug therapy: Secondary | ICD-10-CM | POA: Diagnosis not present

## 2015-03-19 DIAGNOSIS — I10 Essential (primary) hypertension: Secondary | ICD-10-CM

## 2015-03-19 MED ORDER — ENALAPRIL MALEATE 10 MG PO TABS: 10.0000 mg | ORAL_TABLET | Freq: Every day | ORAL | Status: DC

## 2015-03-19 NOTE — Progress Notes (Signed)
   Subjective:    Patient ID: Ethan Walls, male    DOB: [birth year], 54 y.o.   MRN: [id number]  Hypertension This is a chronic problem. The current episode started more than 1 year ago. The problem has been gradually improving since onset. There are no associated agents to hypertension. There are no known risk factors for coronary artery disease. Treatments tried: enalapril. The current treatment provides moderate improvement. There are no compliance problems.    Patient has no concerns at this time.   bp not cked  Not exercising much   Sticking with meds faithfully  Patient has history of hyperlipidemia. Trying to work on his intake of fats.  Review of Systems    no headache no chest pain no back pain Objective:   Physical Exam Blood pressure 136/86 on repeat. Lungs clear. Heart rare rhythm H&T normal. Ankles without edema       Assessment & Plan:  Impression 1 hypertension good control #2 hyperlipidemia status uncertain plan appropriate blood work. Medications refilled. Recheck in 6 months. Wellness exam then also

## 2015-03-20 LAB — BASIC METABOLIC PANEL
BUN/Creatinine Ratio: 16 (ref 9–20)
BUN: 17 mg/dL (ref 6–24)
CALCIUM: 9.7 mg/dL (ref 8.7–10.2)
CHLORIDE: 101 mmol/L (ref 97–108)
CO2: 24 mmol/L (ref 18–29)
Creatinine, Ser: 1.09 mg/dL (ref 0.76–1.27)
GFR calc Af Amer: 87 mL/min/{1.73_m2} (ref >59)
GFR calc non Af Amer: 75 mL/min/{1.73_m2} (ref >59)
GLUCOSE: 99 mg/dL (ref 65–99)
POTASSIUM: 4.6 mmol/L (ref 3.5–5.2)
SODIUM: 140 mmol/L (ref 134–144)

## 2015-03-20 LAB — LIPID PANEL
CHOLESTEROL TOTAL: 213 mg/dL — AB (ref 100–199)
Chol/HDL Ratio: 4.4 ratio units (ref 0.0–5.0)
HDL: 48 mg/dL (ref >39)
LDL CALC: 147 mg/dL — AB (ref 0–99)
TRIGLYCERIDES: 88 mg/dL (ref 0–149)
VLDL Cholesterol Cal: 18 mg/dL (ref 5–40)

## 2015-03-20 LAB — HEPATIC FUNCTION PANEL
ALT: 39 IU/L (ref 0–44)
AST: 30 IU/L (ref 0–40)
Albumin: 4.6 g/dL (ref 3.5–5.5)
Alkaline Phosphatase: 68 IU/L (ref 39–117)
BILIRUBIN, DIRECT: 0.13 mg/dL (ref 0.00–0.40)
Bilirubin Total: 0.5 mg/dL (ref 0.0–1.2)
TOTAL PROTEIN: 7 g/dL (ref 6.0–8.5)

## 2015-03-20 LAB — PSA: Prostate Specific Ag, Serum: 0.2 ng/mL (ref 0.0–4.0)

## 2015-03-25 ENCOUNTER — Encounter: Payer: Self-pay | Admitting: Family Medicine

## 2015-04-10 DIAGNOSIS — C4491 Basal cell carcinoma of skin, unspecified: Secondary | ICD-10-CM

## 2015-04-10 HISTORY — DX: Basal cell carcinoma of skin, unspecified: C44.91

## 2015-06-13 ENCOUNTER — Encounter (HOSPITAL_COMMUNITY): Payer: Self-pay | Admitting: Emergency Medicine

## 2015-06-13 ENCOUNTER — Emergency Department (HOSPITAL_COMMUNITY)
Admission: EM | Admit: 2015-06-13 | Discharge: 2015-06-14 | Disposition: A | Discharge status: Home / Self Care | Payer: BLUE CROSS/BLUE SHIELD | Attending: Emergency Medicine | Admitting: Emergency Medicine

## 2015-06-13 DIAGNOSIS — I10 Essential (primary) hypertension: Secondary | ICD-10-CM | POA: Diagnosis not present

## 2015-06-13 DIAGNOSIS — Z79899 Other long term (current) drug therapy: Secondary | ICD-10-CM | POA: Insufficient documentation

## 2015-06-13 DIAGNOSIS — R066 Hiccough: Secondary | ICD-10-CM | POA: Diagnosis not present

## 2015-06-13 HISTORY — DX: Hiccough: R06.6

## 2015-06-13 MED ORDER — CHLORPROMAZINE HCL 25 MG/ML IJ SOLN
25.0000 mg | Freq: Once | INTRAMUSCULAR | Status: AC
  Administered 2015-06-13: 25 mg via INTRAMUSCULAR
  Filled 2015-06-13: qty 1

## 2015-06-14 ENCOUNTER — Encounter: Payer: Self-pay | Admitting: Family Medicine

## 2015-06-14 ENCOUNTER — Ambulatory Visit (INDEPENDENT_AMBULATORY_CARE_PROVIDER_SITE_OTHER): Payer: BLUE CROSS/BLUE SHIELD | Admitting: Family Medicine

## 2015-06-14 VITALS — BP 140/90 | Temp 98.3°F | Ht 68.25 in | Wt 210.5 lb

## 2015-06-14 DIAGNOSIS — I10 Essential (primary) hypertension: Secondary | ICD-10-CM | POA: Diagnosis not present

## 2015-06-14 DIAGNOSIS — R066 Hiccough: Secondary | ICD-10-CM | POA: Diagnosis not present

## 2015-06-14 MED ORDER — CHLORPROMAZINE HCL 25 MG PO TABS: 25.0000 mg | ORAL_TABLET | Freq: Three times a day (TID) | ORAL | Status: DC | PRN

## 2015-06-14 MED ORDER — PANTOPRAZOLE SODIUM 40 MG PO TBEC
40.0000 mg | DELAYED_RELEASE_TABLET | Freq: Once | ORAL | Status: AC
  Administered 2015-06-14: 40 mg via ORAL
  Filled 2015-06-14: qty 1

## 2015-06-14 NOTE — Discharge Instructions (Signed)
Hiccups A hiccup is the result of a sudden shortening of the muscle below your lungs (diaphragm). This movement of your diaphragm is then followed by the closing of your vocal cords, which causes the hiccup sound. Most people get the hiccups. Typically, hiccups last only a short amount of time. There are three types of hiccups:   Benign: last less than 48 hours.  Persistent: last more than 48 hours, but less than 1 month.  Intractable: last more than 1 month. A hiccup is a reflex. You cannot control reflexes. CAUSES  Causes of the hiccups can include:   Eating too much.  Drinking too much alcohol or fizzy drinks.  Eating too fast.  Eating or drinking hot and spicy foods or drinks.  Using certain medicines that have hiccupping as a side effect. Several medical conditions may also cause hiccups, including, but not limited to:  Stroke.  Gastroesophageal reflux.  Multiple sclerosis.  Traumatic brain injury.  Brain tumor.  Meningitis.  Having damage to the nerve that affects the diaphragm. Usually, though, hiccups have no apparent cause and are not the result of a serious medical condition. DIAGNOSIS  Tests may be performed to diagnose a possible condition associated with persistent or intractable hiccups. TREATMENT  Most cases of the hiccups need no treatment. None of the numerous home remedies have been proven to be effective. If your hiccups do require treatment, your treatment may include:  Medicine. Medicine may be given intravenously (by IV) or by mouth.  Hypnosis or acupuncture.  Surgery to the nerve that affects the diaphragm may be tried in severe cases. If your hiccups are caused by an underlying medical condition, treatment for the medical condition may be necessary.  HOME CARE INSTRUCTIONS   Eat small meals.  Limit alcohol intake to no more than 1 drink per day for nonpregnant women and 2 drinks per day for men. One drink equals 12 ounces of beer, 5 ounces  of wine, or 1 ounces of hard liquor.  Limit drinking fizzy drinks.  Eat and chew your food slowly.  Take medicines only as directed by your health care provider. SEEK MEDICAL CARE IF:   Your hiccups last for more than 48 hours.  You are given medicine, but your hiccups do not get better.  You cannot sleep or eat due to the hiccups.  You have unexpected weight loss due to the hiccups.  You have trouble breathing or swallowing.  You have a fever.  You develop severe pain in your abdomen.  You develop numbness, tingling, or weakness. Document Released: 11/23/2001 Document Revised: 01/29/2014 Document Reviewed: 11/05/2010 ExitCare Patient Information 2015 [employer]. This information is not intended to replace advice given to you by your health care provider. Make sure you discuss any questions you have with your health care provider.  

## 2015-06-14 NOTE — ED Provider Notes (Signed)
CSN: [id number]     Arrival date & time 06/13/15  2035 History   First MD Initiated Contact with Patient 06/13/15 2323     Chief Complaint  Patient presents with  . Hiccups     (Consider location/radiation/quality/duration/timing/severity/associated sxs/prior Treatment) HPI   Ethan Walls is a 54 y.o. male who presents to the Emergency Department complaining of persistent hiccups for 4 days.  He states this is a recurrent problem for him and notes that his hiccups "seem to be in spells"  He reports having repeated "back to back" hiccups lasting several minutes.  He has taken prescription thorazine tablets from a previous episode but states the pills are several years old and do not seem to be helping his symptoms.  He denies chest pain, fever, vomiting, abdominal pain.  He states that he has an appt with PMD tomorrow for same, but symptoms began severe this evening.     Past Medical History  Diagnosis Date  . Hypertension   . Hiccups    History reviewed. No pertinent past surgical history. History reviewed. No pertinent family history. Social History  Substance Use Topics  . Smoking status: Never Smoker   . Smokeless tobacco: Never Used  . Alcohol Use: No    Review of Systems  Constitutional: Negative for fever, activity change and appetite change.  HENT: Negative for sore throat.   Respiratory: Negative for chest tightness and shortness of breath.   Cardiovascular: Negative for chest pain.  Gastrointestinal: Negative for nausea, vomiting and abdominal pain.  Musculoskeletal: Negative for arthralgias.  Neurological: Negative for dizziness, weakness and numbness.  All other systems reviewed and are negative.     Allergies  Review of patient's allergies indicates no known allergies.  Home Medications   Prior to Admission medications   Medication Sig Start Date End Date Taking? Authorizing Provider  chlorproMAZINE (THORAZINE) 25 MG tablet Take 1 tablet (25 mg total)  by mouth 3 (three) times daily as needed for hiccoughs. 06/14/15   Tammy Triplett, PA-C  enalapril (VASOTEC) 10 MG tablet Take 1 tablet (10 mg total) by mouth daily. 03/19/15   Mikey Kirschner, MD   BP 149/106 mmHg  Pulse 81  Temp(Src) 98.1 F (36.7 C) (Oral)  Resp 20  Ht 5\' 10"  (1.778 m)  Wt 215 lb (97.523 kg)  BMI 30.85 kg/m2  SpO2 98% Physical Exam  Constitutional: He is oriented to person, place, and time. He appears well-developed and well-nourished. No distress.  HENT:  Mouth/Throat: Oropharynx is clear and moist.  Neck: Normal range of motion. Neck supple. No thyromegaly present.  Cardiovascular: Normal rate, regular rhythm and normal heart sounds.   No murmur heard. Pulmonary/Chest: Effort normal and breath sounds normal. No respiratory distress.  Abdominal: Soft. He exhibits no distension. There is no tenderness. There is no rebound and no guarding.  Musculoskeletal: Normal range of motion.  Neurological: He is alert and oriented to person, place, and time. He exhibits normal muscle tone. Coordination normal.  Skin: Skin is warm and dry.  Psychiatric: He has a normal mood and affect.  Nursing note and vitals reviewed.   ED Course  Procedures (including critical care time) Labs Review Labs Reviewed - No data to display  Imaging Review No results found. I have personally reviewed and evaluated these images and lab results as part of my medical decision-making.   EKG Interpretation None      MDM   Final diagnoses:  Intractable hiccups    Pt is  well appearing, vitals stable, non-toxic.  Hx of same.  Pt was observed after thorazine injection and hiccups have resolved.  Feeling better and states he is ready for discharge.  Agrees to PMD f/u for Friday morning at 11:00 am.    Kem Parkinson, PA-C 06/14/15 [place], MD 06/14/15 [phone]

## 2015-06-14 NOTE — Progress Notes (Signed)
   Subjective:    Patient ID: Ethan Walls, male    DOB: January 15, 1961, 54 y.o.   MRN: [id number] Patient arrives office with several concerns HPI  Patient is in today for excessive pickups. Patient states that this is a re occuring problem that occurs every 2 to 3 years. Often has good emergency room for an injection. That generally helps.   Had ER visit last night at [facility]. and was prescribed Chlorpromazine 25MG .   Pt very concerned about his blood pressure. It was quite elevated last night in emergency room and sick. Numbers reviewed show systolic as high as 419. Wonders if he needs to go up on medication. Claims compliance with his blood pressure medicine. nPatient also states that he has had increased blood pressure with readings such as 149/106.  Patient states that the PA who saw him in emergency room recommended he consider a GI referral and endoscopy with his hiccups patient would like to talk about this Review of Systems No headache no chest pain no back pain abdominal pain no change in bowel habits    Objective:   Physical Exam Alert vitals stable blood pressure on repeat 126/82. Alert lungs clear. Heart rare rhythm. HEENT normal. Neuro intact. Abdomen benign. Negative hiccups       Assessment & Plan:  Impression 1 persistent recurrent hiccups. Now clinically much improved. Question regarding referral discussed at length. Since these are intermittent have occurred for many many years and generally go away for a couple 3 years. Chance of GI workup extremely low as far as etiology of hiccups. Discussed. Multiple questions answered #2 hypertension exacerbation last night but now numbers great. Patient has multiple questions on this. Multiple questions answered I feel it would be a mistake to increased blood pressure medicine rationale discussed at length. Patient to use medicine when necessary WSL

## 2015-07-31 IMAGING — CR DG CHEST 2V
2 series · 2 of 2 positions shown · non-contrast
Comparison: Prior radiograph from 08/12/2010

CLINICAL DATA: Cough and sore throat

EXAM:
CHEST  2 VIEW

[view not recorded (1 of 2)]
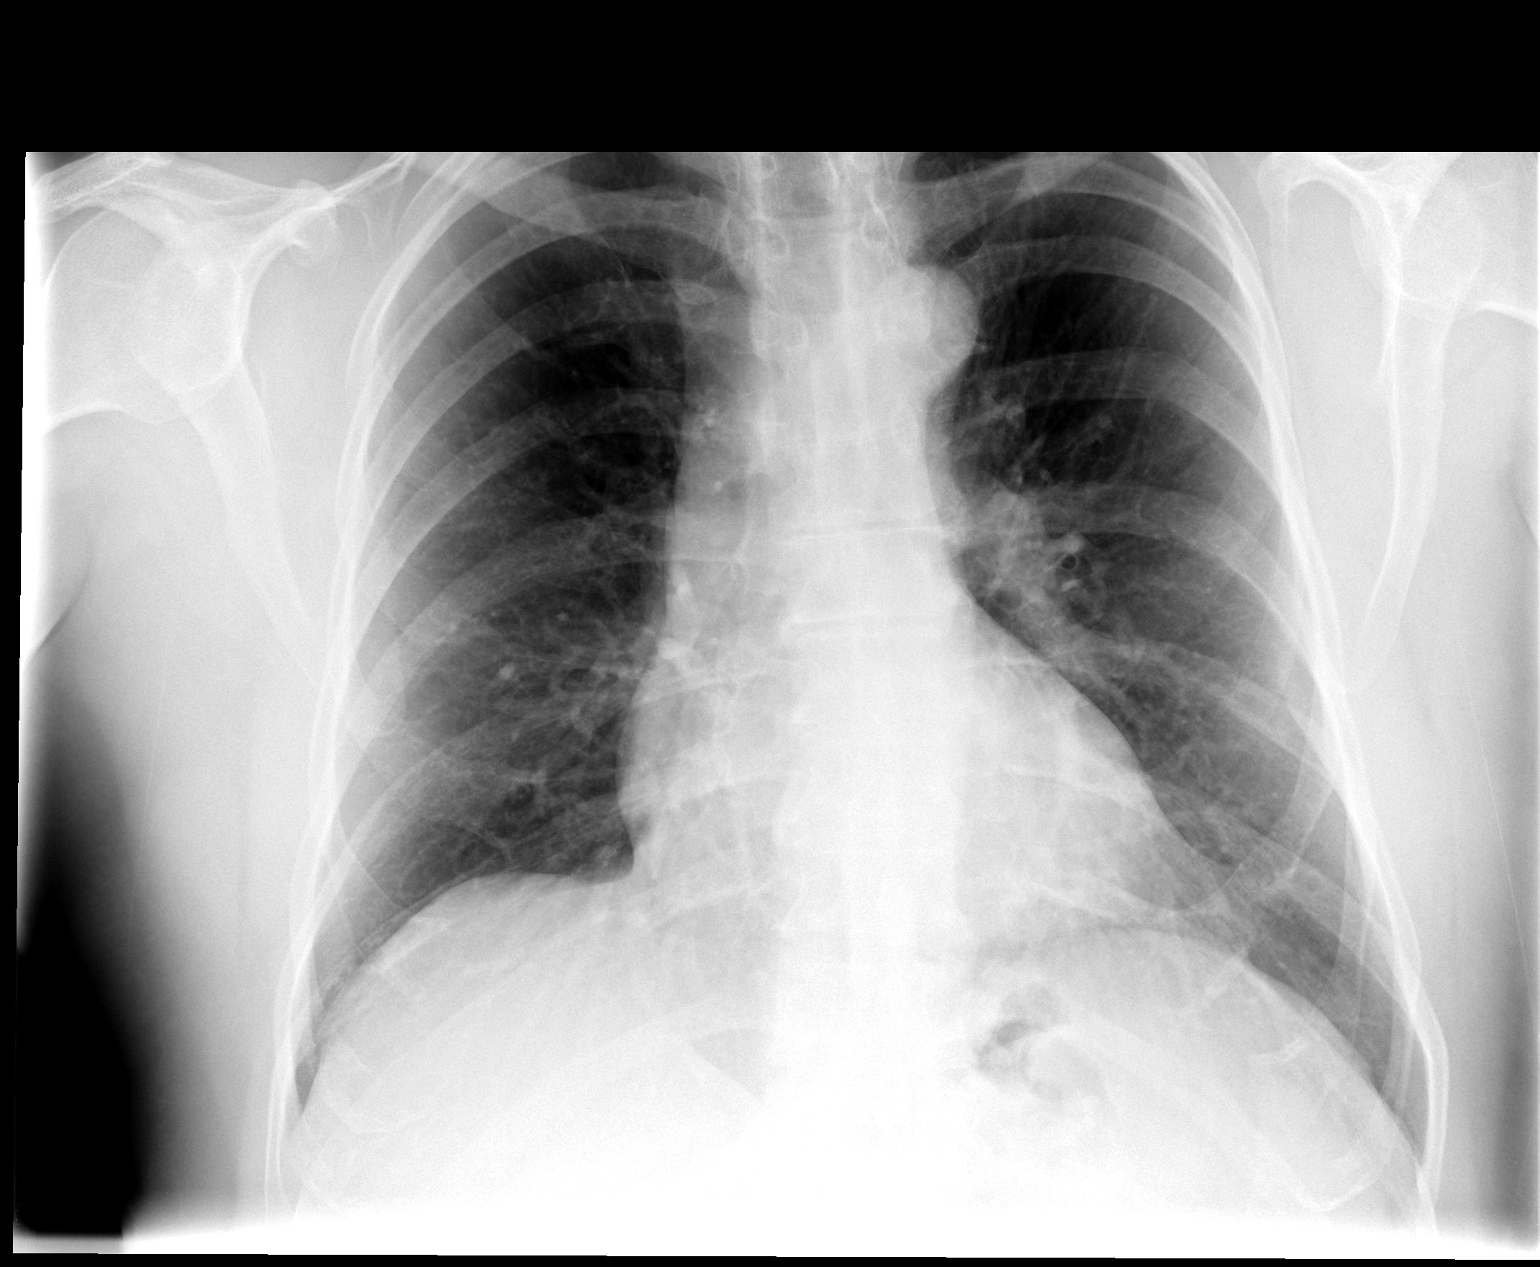

[view not recorded (2 of 2)]
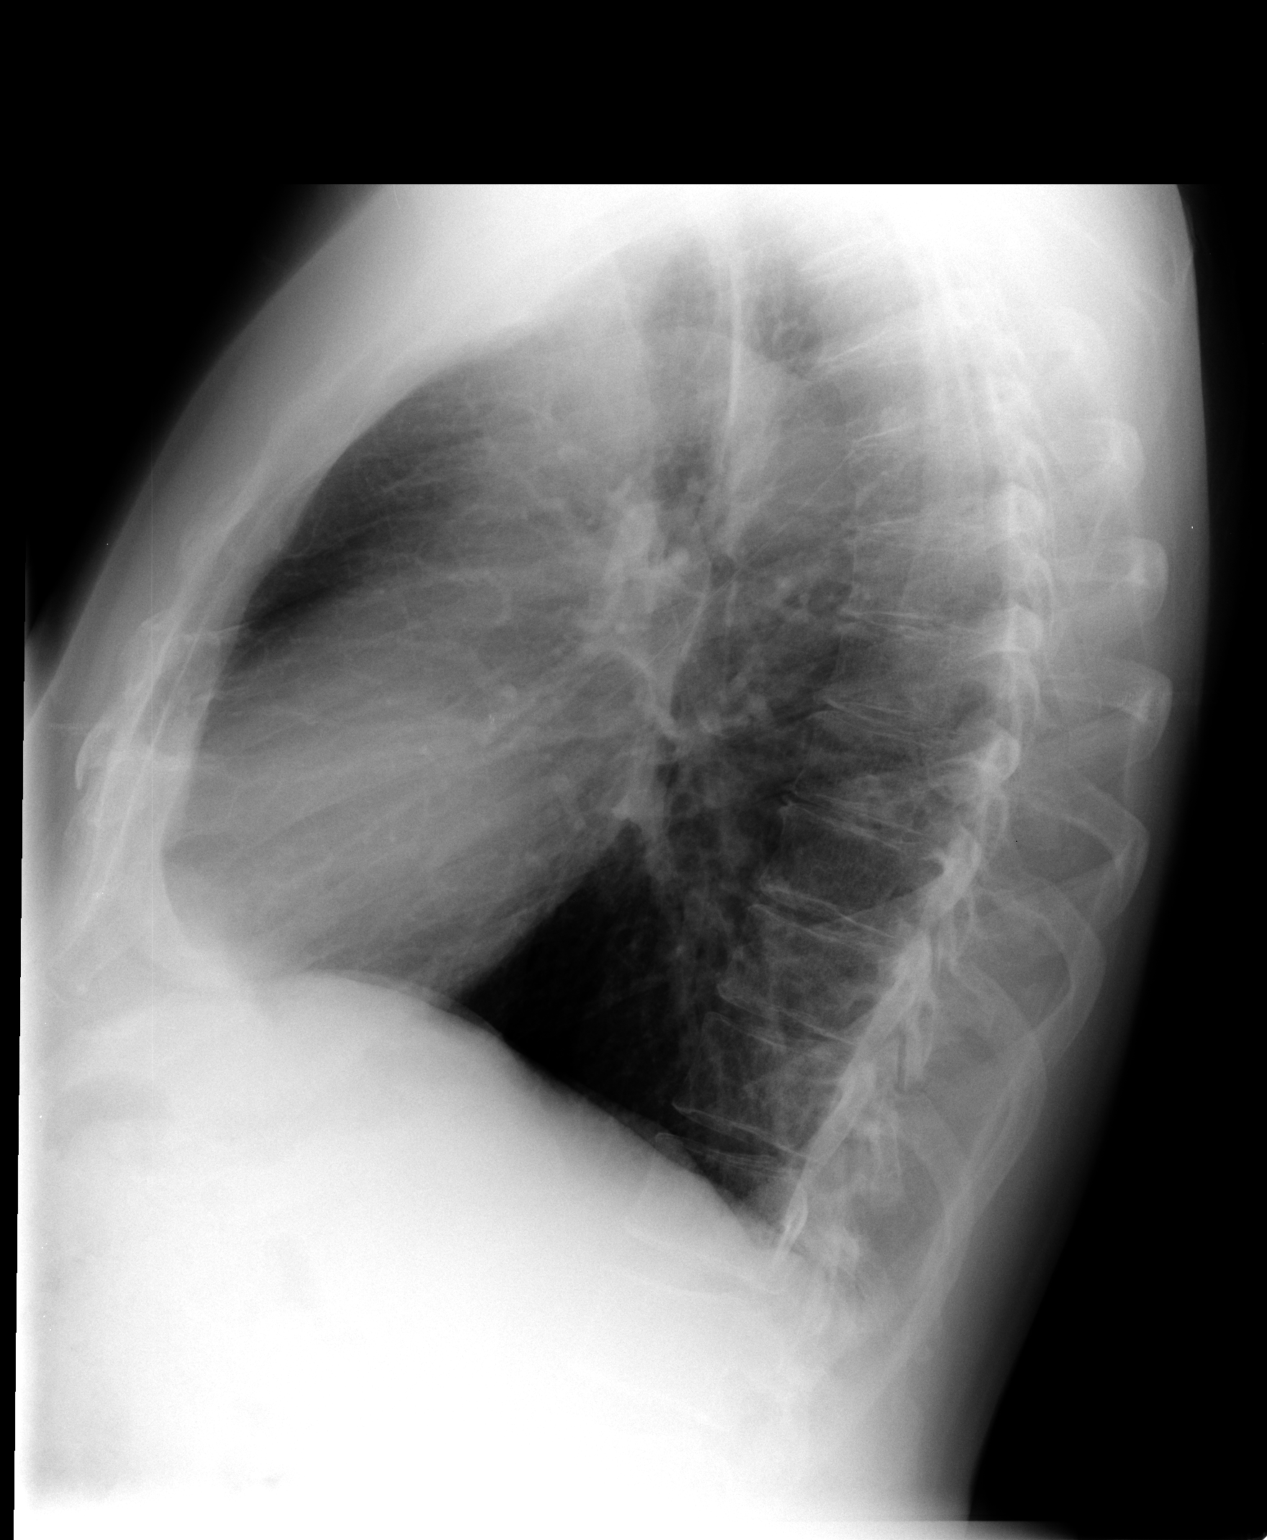

[2 of 2 positions shown; findings below may reference images not displayed]

FINDINGS: The cardiac and mediastinal silhouettes are stable in size and
contour, and remain within normal limits.

The lungs are normally inflated. No airspace consolidation, pleural
effusion, or pulmonary edema is identified. There is no
pneumothorax.

No acute osseous abnormality identified.
IMPRESSION: No acute cardiopulmonary abnormality identified.

## 2015-09-11 ENCOUNTER — Encounter: Payer: BLUE CROSS/BLUE SHIELD | Admitting: Family Medicine

## 2015-10-24 ENCOUNTER — Ambulatory Visit (INDEPENDENT_AMBULATORY_CARE_PROVIDER_SITE_OTHER): Payer: BLUE CROSS/BLUE SHIELD | Admitting: Family Medicine

## 2015-10-24 ENCOUNTER — Encounter: Payer: Self-pay | Admitting: Family Medicine

## 2015-10-24 VITALS — BP 134/85 | Ht 68.25 in | Wt 213.1 lb

## 2015-10-24 DIAGNOSIS — Z0000 Encounter for general adult medical examination without abnormal findings: Secondary | ICD-10-CM | POA: Diagnosis not present

## 2015-10-24 DIAGNOSIS — I10 Essential (primary) hypertension: Secondary | ICD-10-CM

## 2015-10-24 DIAGNOSIS — J329 Chronic sinusitis, unspecified: Secondary | ICD-10-CM

## 2015-10-24 DIAGNOSIS — J310 Chronic rhinitis: Secondary | ICD-10-CM

## 2015-10-24 MED ORDER — ENALAPRIL MALEATE 10 MG PO TABS: 10.0000 mg | ORAL_TABLET | Freq: Every day | ORAL | Status: DC

## 2015-10-24 MED ORDER — AMOXICILLIN 500 MG PO CAPS: 500.0000 mg | ORAL_CAPSULE | Freq: Three times a day (TID) | ORAL | Status: DC

## 2015-10-24 NOTE — Progress Notes (Signed)
   Subjective:    Patient ID: Ethan Walls, male    DOB: [birth year], 55 y.o.   MRN: [id number]  HPI The patient comes in today for a wellness visit.    A review of their health history was completed.  A review of medications was also completed.  Any needed refills; Yes, refills on Enalapril,does not miss a dose. Compliant with medicine watch his salt intake no obvious side effects   Eating habits: Patient states eating habits a fair. States tries to eat healthy.  Falls/  MVA accidents in past few months: None  Regular exercise: No, does not exercise outside of work.  Specialist pt sees on regular basis: None  Preventative health issues were discussed.   Additional concerns: Patient has concerns of light headiness spell that lasted 24 hrs.  Pt felt dizzy and lightheaded, had a disagreement with someone ths same day, got home and felt lightheaded, almost dizzy,slowly it went away, took 24 hrs Happened  A couple weekends ago.   Positive congestion drainage stuffiness sinus pressure really for the past month. Next   notes intermittent loose stools. Has never had a colonoscopy.  Day numb [phone]. Leave a voic mail   Review of Systems  Constitutional: Negative for fever, activity change and appetite change.  HENT: Negative for congestion and rhinorrhea.   Eyes: Negative for discharge.  Respiratory: Negative for cough and wheezing.   Cardiovascular: Negative for chest pain.  Gastrointestinal: Negative for vomiting, abdominal pain and blood in stool.  Genitourinary: Negative for frequency and difficulty urinating.  Musculoskeletal: Negative for neck pain.  Skin: Negative for rash.  Allergic/Immunologic: Negative for environmental allergies and food allergies.  Neurological: Negative for weakness and headaches.  Psychiatric/Behavioral: Negative for agitation.  All other systems reviewed and are negative.      Objective:   Physical Exam  Constitutional: He appears  well-developed and well-nourished.  HENT:  Head: Normocephalic and atraumatic.  Right Ear: External ear normal.  Left Ear: External ear normal.  Nose: Nose normal.  Mouth/Throat: Oropharynx is clear and moist.  Pos nasal cong   Eyes: EOM are normal. Pupils are equal, round, and reactive to light.  Neck: Normal range of motion. Neck supple. No thyromegaly present.  Cardiovascular: Normal rate, regular rhythm and normal heart sounds.   No murmur heard. Pulmonary/Chest: Effort normal and breath sounds normal. No respiratory distress. He has no wheezes.  Abdominal: Soft. Bowel sounds are normal. He exhibits no distension and no mass. There is no tenderness.  Genitourinary: Prostate normal and penis normal.  Musculoskeletal: Normal range of motion. He exhibits no edema.  Lymphadenopathy:    He has no cervical adenopathy.  Neurological: He is alert. He exhibits normal muscle tone.  Skin: Skin is warm and dry. No erythema.  Psychiatric: He has a normal mood and affect. His behavior is normal. Judgment normal.  Vitals reviewed.         Assessment & Plan:  IMp welness exam 2 htn good control meds reviewed mainatian same meds 3 subacutte rhinosinusitsi 4 nonspec bowel changes with no colonoscopy

## 2015-10-30 ENCOUNTER — Encounter: Payer: Self-pay | Admitting: Family Medicine

## 2015-11-21 ENCOUNTER — Telehealth: Payer: Self-pay

## 2015-11-21 NOTE — Telephone Encounter (Signed)
Triaged pt today.  

## 2015-11-21 NOTE — Telephone Encounter (Signed)
Pt received triage letter from DS. Please call him at [phone]

## 2015-11-25 NOTE — Telephone Encounter (Signed)
Gastroenterology Pre-Procedure Review  Request Date: 11/21/2015 Requesting Physician: Dr. Mickie Hillier  PATIENT REVIEW QUESTIONS: The patient responded to the following health history questions as indicated:    1. Diabetes Melitis: no 2. Joint replacements in the past 12 months: no 3. Major health problems in the past 3 months: no 4. Has an artificial valve or MVP: no 5. Has a defibrillator: no 6. Has been advised in past to take antibiotics in advance of a procedure like teeth cleaning: no 7. Family history of colon cancer: no  8. Alcohol Use: no 9. History of sleep apnea: no     MEDICATIONS & ALLERGIES:    Patient reports the following regarding taking any blood thinners:   Plavix? no Aspirin? no Coumadin? no  Patient confirms/reports the following medications:  Current Outpatient Prescriptions  Medication Sig Dispense Refill  . chlorproMAZINE (THORAZINE) 25 MG tablet Take 1 tablet (25 mg total) by mouth 3 (three) times daily as needed for hiccoughs. 15 tablet 0  . enalapril (VASOTEC) 10 MG tablet Take 1 tablet (10 mg total) by mouth daily. 90 tablet 1  . amoxicillin (AMOXIL) 500 MG capsule Take 1 capsule (500 mg total) by mouth 3 (three) times daily. For 10 days (Patient not taking: Reported on 11/21/2015) 30 capsule 0   No current facility-administered medications for this visit.    Patient confirms/reports the following allergies:  No Known Allergies  No orders of the defined types were placed in this encounter.    AUTHORIZATION INFORMATION Primary Insurance:   ID #:   Group #:  Pre-Cert / Auth required:  Pre-Cert / Auth #:   Secondary Insurance:   ID #: Group #:  Pre-Cert / Auth required:  Pre-Cert / Auth #:   SCHEDULE INFORMATION: Procedure has been scheduled as follows:  Date: 12/19/2015           Time:  2:00 PM Location: [facility] Short Stay  This Gastroenterology Pre-Precedure Review Form is being routed to the following provider(s): R. Garfield Cornea, MD

## 2015-11-26 NOTE — Telephone Encounter (Signed)
Appropriate.

## 2015-11-27 ENCOUNTER — Other Ambulatory Visit: Payer: Self-pay

## 2015-11-27 DIAGNOSIS — Z1211 Encounter for screening for malignant neoplasm of colon: Secondary | ICD-10-CM

## 2015-11-27 MED ORDER — PEG 3350-KCL-NA BICARB-NACL 420 G PO SOLR: 4000.0000 mL | ORAL | Status: DC

## 2015-11-27 NOTE — Telephone Encounter (Signed)
Rx sent to the pharmacy and instructions mailed to pt.  

## 2015-12-05 ENCOUNTER — Telehealth: Payer: Self-pay

## 2015-12-05 NOTE — Telephone Encounter (Signed)
I called [employer] at [phone] and spoke to [facility] A who said a PA is not required for the screening colonoscopy.

## 2015-12-19 ENCOUNTER — Ambulatory Visit (HOSPITAL_COMMUNITY)
Admission: RE | Admit: 2015-12-19 | Discharge: 2015-12-19 | Disposition: A | Discharge status: Home / Self Care | Payer: BLUE CROSS/BLUE SHIELD | Source: Ambulatory Visit | Attending: Internal Medicine | Admitting: Internal Medicine

## 2015-12-19 ENCOUNTER — Encounter (HOSPITAL_COMMUNITY): Payer: Self-pay | Admitting: *Deleted

## 2015-12-19 ENCOUNTER — Encounter (HOSPITAL_COMMUNITY)
Admission: RE | Disposition: A | Discharge status: Home / Self Care | Payer: Self-pay | Source: Ambulatory Visit | Attending: Internal Medicine

## 2015-12-19 DIAGNOSIS — I10 Essential (primary) hypertension: Secondary | ICD-10-CM | POA: Diagnosis not present

## 2015-12-19 DIAGNOSIS — Z1211 Encounter for screening for malignant neoplasm of colon: Secondary | ICD-10-CM

## 2015-12-19 DIAGNOSIS — D120 Benign neoplasm of cecum: Secondary | ICD-10-CM | POA: Insufficient documentation

## 2015-12-19 DIAGNOSIS — Z79899 Other long term (current) drug therapy: Secondary | ICD-10-CM | POA: Insufficient documentation

## 2015-12-19 DIAGNOSIS — D125 Benign neoplasm of sigmoid colon: Secondary | ICD-10-CM | POA: Diagnosis not present

## 2015-12-19 DIAGNOSIS — Z860100 Personal history of colon polyps, unspecified: Secondary | ICD-10-CM | POA: Insufficient documentation

## 2015-12-19 DIAGNOSIS — R066 Hiccough: Secondary | ICD-10-CM | POA: Insufficient documentation

## 2015-12-19 DIAGNOSIS — K5730 Diverticulosis of large intestine without perforation or abscess without bleeding: Secondary | ICD-10-CM | POA: Insufficient documentation

## 2015-12-19 HISTORY — PX: COLONOSCOPY: SHX5424

## 2015-12-19 SURGERY — COLONOSCOPY
Anesthesia: Moderate Sedation

## 2015-12-19 MED ORDER — MEPERIDINE HCL 100 MG/ML IJ SOLN
INTRAMUSCULAR | Status: DC | PRN
  Administered 2015-12-19 (×2): 50 mg

## 2015-12-19 MED ORDER — MIDAZOLAM HCL 5 MG/5ML IJ SOLN
INTRAMUSCULAR | Status: AC
  Filled 2015-12-19: qty 5

## 2015-12-19 MED ORDER — MEPERIDINE HCL 100 MG/ML IJ SOLN
INTRAMUSCULAR | Status: DC
Start: 2015-12-19 — End: 2015-12-19
  Filled 2015-12-19: qty 1

## 2015-12-19 MED ORDER — SODIUM CHLORIDE 0.9 % IV SOLN
INTRAVENOUS | Status: DC
  Administered 2015-12-19: 14:00:00 via INTRAVENOUS

## 2015-12-19 MED ORDER — MIDAZOLAM HCL 5 MG/5ML IJ SOLN
INTRAMUSCULAR | Status: DC | PRN
  Administered 2015-12-19: 1 mg via INTRAVENOUS
  Administered 2015-12-19 (×2): 2 mg via INTRAVENOUS

## 2015-12-19 MED ORDER — STERILE WATER FOR IRRIGATION IR SOLN
Status: DC | PRN
  Administered 2015-12-19: 2.5 mL

## 2015-12-19 MED ORDER — ONDANSETRON HCL 4 MG/2ML IJ SOLN
INTRAMUSCULAR | Status: DC | PRN
  Administered 2015-12-19: 4 mg via INTRAVENOUS

## 2015-12-19 MED ORDER — ONDANSETRON HCL 4 MG/2ML IJ SOLN
INTRAMUSCULAR | Status: AC
  Filled 2015-12-19: qty 2

## 2015-12-19 NOTE — H&P (Signed)
@  [id number]   Primary Care Physician:  Mickie Hillier, MD Primary Gastroenterologist:  Dr. Gala Romney  Pre-Procedure History & Physical: HPI:  Ethan Walls is a 55 y.o. male is here for a screening colonoscopy. No prior colonoscopy. No bowel symptoms. No family history of colon cancer.  Past Medical History  Diagnosis Date  . Hypertension   . Hiccups     Past Surgical History  Procedure Laterality Date  . Left index finger      Prior to Admission medications   Medication Sig Start Date End Date Taking? Authorizing Provider  enalapril (VASOTEC) 10 MG tablet Take 1 tablet (10 mg total) by mouth daily. 10/24/15  Yes Mikey Kirschner, MD  polyethylene glycol-electrolytes (TRILYTE) 420 g solution Take 4,000 mLs by mouth as directed. 11/27/15  Yes Daneil Dolin, MD  amoxicillin (AMOXIL) 500 MG capsule Take 1 capsule (500 mg total) by mouth 3 (three) times daily. For 10 days Patient not taking: Reported on 11/21/2015 10/24/15   Mikey Kirschner, MD  chlorproMAZINE (THORAZINE) 25 MG tablet Take 1 tablet (25 mg total) by mouth 3 (three) times daily as needed for hiccoughs. 06/14/15   Tammy Triplett, PA-C    Allergies as of 11/27/2015  . (No Known Allergies)    History reviewed. No pertinent family history.  Social History   Social History  . Marital Status: Married    Spouse Name: N/A  . Number of Children: N/A  . Years of Education: N/A   Occupational History  . Not on file.   Social History Main Topics  . Smoking status: Never Smoker   . Smokeless tobacco: Never Used  . Alcohol Use: No  . Drug Use: No  . Sexual Activity: Not on file   Other Topics Concern  . Not on file   Social History Narrative    Review of Systems: See HPI, otherwise negative ROS  Physical Exam: BP 148/85 mmHg  Pulse 73  Temp(Src) 98.5 F (36.9 C) (Oral)  Resp 13  Ht 5\' 9"  (1.753 m)  Wt 212 lb (96.163 kg)  BMI 31.29 kg/m2  SpO2 98% General:   Alert,  Well-developed, well-nourished, pleasant and  cooperative in NAD Head:  Normocephalic and atraumatic. Eyes:  Sclera clear, no icterus.   Conjunctiva pink. Lungs:  Clear throughout to auscultation.   No wheezes, crackles, or rhonchi. No acute distress. Heart:  Regular rate and rhythm; no murmurs, clicks, rubs,  or gallops. Abdomen:  Soft, nontender and nondistended. No masses, hepatosplenomegaly or hernias noted. Normal bowel sounds, without guarding, and without rebound.    Impression/Plan: Ethan Walls is now here to undergo a screening colonoscopy.  First ever average risk screening colonoscopy.  Risks, benefits, limitations, imponderables and alternatives regarding colonoscopy have been reviewed with the patient. Questions have been answered. All parties agreeable.     Notice:  This dictation was prepared with Dragon dictation along with smaller phrase technology. Any transcriptional errors that result from this process are unintentional and may not be corrected upon review.

## 2015-12-19 NOTE — Discharge Instructions (Signed)
Colonoscopy Discharge Instructions  Read the instructions outlined below and refer to this sheet in the next few weeks. These discharge instructions provide you with general information on caring for yourself after you leave the hospital. Your doctor may also give you specific instructions. While your treatment has been planned according to the most current medical practices available, unavoidable complications occasionally occur. If you have any problems or questions after discharge, call Dr. Gala Romney at [phone]. ACTIVITY  You may resume your regular activity, but move at a slower pace for the next 24 hours.   Take frequent rest periods for the next 24 hours.   Walking will help get rid of the air and reduce the bloated feeling in your belly (abdomen).   No driving for 24 hours (because of the medicine (anesthesia) used during the test).    Do not sign any important legal documents or operate any machinery for 24 hours (because of the anesthesia used during the test).  NUTRITION  Drink plenty of fluids.   You may resume your normal diet as instructed by your doctor.   Begin with a light meal and progress to your normal diet. Heavy or fried foods are harder to digest and may make you feel sick to your stomach (nauseated).   Avoid alcoholic beverages for 24 hours or as instructed.  MEDICATIONS  You may resume your normal medications unless your doctor tells you otherwise.  WHAT YOU CAN EXPECT TODAY  Some feelings of bloating in the abdomen.   Passage of more gas than usual.   Spotting of blood in your stool or on the toilet paper.  IF YOU HAD POLYPS REMOVED DURING THE COLONOSCOPY:  No aspirin products for 7 days or as instructed.   No alcohol for 7 days or as instructed.   Eat a soft diet for the next 24 hours.  FINDING OUT THE RESULTS OF YOUR TEST Not all test results are available during your visit. If your test results are not back during the visit, make an appointment  with your caregiver to find out the results. Do not assume everything is normal if you have not heard from your caregiver or the medical facility. It is important for you to follow up on all of your test results.  SEEK IMMEDIATE MEDICAL ATTENTION IF:  You have more than a spotting of blood in your stool.   Your belly is swollen (abdominal distention).   You are nauseated or vomiting.   You have a temperature over 101.   You have abdominal pain or discomfort that is severe or gets worse throughout the day.    Colon polyp and diverticulosis information provided  Consider Benefiber 1 tablespoon twice daily.  Further recommendations to follow pending review of pathology report  Diverticulosis Diverticulosis is the condition that develops when small pouches (diverticula) form in the wall of your colon. Your colon, or large intestine, is where water is absorbed and stool is formed. The pouches form when the inside layer of your colon pushes through weak spots in the outer layers of your colon. CAUSES  No one knows exactly what causes diverticulosis. RISK FACTORS  Being older than 60. Your risk for this condition increases with age. Diverticulosis is rare in people younger than 40 years. By age 85, almost everyone has it.  Eating a low-fiber diet.  Being frequently constipated.  Being overweight.  Not getting enough exercise.  Smoking.  Taking over-the-counter pain medicines, like aspirin and ibuprofen. SYMPTOMS  Most people with  diverticulosis do not have symptoms. DIAGNOSIS  Because diverticulosis often has no symptoms, health care providers often discover the condition during an exam for other colon problems. In many cases, a health care provider will diagnose diverticulosis while using a flexible scope to examine the colon (colonoscopy). TREATMENT  If you have never developed an infection related to diverticulosis, you may not need treatment. If you have had an infection  before, treatment may include:  Eating more fruits, vegetables, and grains.  Taking a fiber supplement.  Taking a live bacteria supplement (probiotic).  Taking medicine to relax your colon. HOME CARE INSTRUCTIONS   Drink at least 6-8 glasses of water each day to prevent constipation.  Try not to strain when you have a bowel movement.  Keep all follow-up appointments. If you have had an infection before:  Increase the fiber in your diet as directed by your health care provider or dietitian.  Take a dietary fiber supplement if your health care provider approves.  Only take medicines as directed by your health care provider. SEEK MEDICAL CARE IF:   You have abdominal pain.  You have bloating.  You have cramps.  You have not gone to the bathroom in 3 days. SEEK IMMEDIATE MEDICAL CARE IF:   Your pain gets worse.  Yourbloating becomes very bad.  You have a fever or chills, and your symptoms suddenly get worse.  You begin vomiting.  You have bowel movements that are bloody or black. MAKE SURE YOU:  Understand these instructions.  Will watch your condition.  Will get help right away if you are not doing well or get worse.   This information is not intended to replace advice given to you by your health care provider. Make sure you discuss any questions you have with your health care provider.   Document Released: 06/11/2004 Document Revised: 09/19/2013 Document Reviewed: 08/09/2013 Elsevier Interactive Patient Education 2016 Elsevier Inc.    Colon Polyps Polyps are lumps of extra tissue growing inside the body. Polyps can grow in the large intestine (colon). Most colon polyps are noncancerous (benign). However, some colon polyps can become cancerous over time. Polyps that are larger than a pea may be harmful. To be safe, caregivers remove and test all polyps. CAUSES  Polyps form when mutations in the genes cause your cells to grow and divide even though no more  tissue is needed. RISK FACTORS There are a number of risk factors that can increase your chances of getting colon polyps. They include: Being older than 50 years. Family history of colon polyps or colon cancer. Long-term colon diseases, such as colitis or Crohn disease. Being overweight. Smoking. Being inactive. Drinking too much alcohol. SYMPTOMS  Most small polyps do not cause symptoms. If symptoms are present, they may include: Blood in the stool. The stool may look dark red or black. Constipation or diarrhea that lasts longer than 1 week. DIAGNOSIS People often do not know they have polyps until their caregiver finds them during a regular checkup. Your caregiver can use 4 tests to check for polyps: Digital rectal exam. The caregiver wears gloves and feels inside the rectum. This test would find polyps only in the rectum. Barium enema. The caregiver puts a liquid called barium into your rectum before taking X-rays of your colon. Barium makes your colon look white. Polyps are dark, so they are easy to see in the X-ray pictures. Sigmoidoscopy. A thin, flexible tube (sigmoidoscope) is placed into your rectum. The sigmoidoscope has a light and  tiny camera in it. The caregiver uses the sigmoidoscope to look at the last third of your colon. Colonoscopy. This test is like sigmoidoscopy, but the caregiver looks at the entire colon. This is the most common method for finding and removing polyps. TREATMENT  Any polyps will be removed during a sigmoidoscopy or colonoscopy. The polyps are then tested for cancer. PREVENTION  To help lower your risk of getting more colon polyps: Eat plenty of fruits and vegetables. Avoid eating fatty foods. Do not smoke. Avoid drinking alcohol. Exercise every day. Lose weight if recommended by your caregiver. Eat plenty of calcium and folate. Foods that are rich in calcium include milk, cheese, and broccoli. Foods that are rich in folate include chickpeas, kidney  beans, and spinach. HOME CARE INSTRUCTIONS Keep all follow-up appointments as directed by your caregiver. You may need periodic exams to check for polyps. SEEK MEDICAL CARE IF: You notice bleeding during a bowel movement.   This information is not intended to replace advice given to you by your health care provider. Make sure you discuss any questions you have with your health care provider.   Document Released: 06/10/2004 Document Revised: 10/05/2014 Document Reviewed: 11/24/2011 Elsevier Interactive Patient Education [employer].

## 2015-12-19 NOTE — Op Note (Signed)
[facility] Patient Name: Ethan Walls Procedure Date: 12/19/2015 2:46 PM MRN: [id number] Date of Birth: 1961/03/14 Attending MD: Norvel Richards , MD CSN: [id number] Age: 55 Admit Type: Outpatient Procedure:                Colonoscopy Indications:              Screening for colorectal malignant neoplasm, This                            is the patient's first colonoscopy Providers:                Norvel Richards, MD, Lurline Del, RN, Georgeann Oppenheim, Technician Referring MD:              Medicines:                Midazolam 5 mg IV, Meperidine 100 mg IV,                            Ondansetron 4 mg IV Complications:            No immediate complications. Estimated Blood Loss:     Estimated blood loss was minimal. Procedure:                Pre-Anesthesia Assessment:                           - Prior to the procedure, a History and Physical                            was performed, and patient medications and                            allergies were reviewed. The patient's tolerance of                            previous anesthesia was also reviewed. The risks                            and benefits of the procedure and the sedation                            options and risks were discussed with the patient.                            All questions were answered, and informed consent                            was obtained. Prior Anticoagulants: The patient has                            taken no previous anticoagulant or antiplatelet  agents. ASA Grade Assessment: II - A patient with                            mild systemic disease. After reviewing the risks                            and benefits, the patient was deemed in                            satisfactory condition to undergo the procedure.                           After obtaining informed consent, the colonoscope                            was passed under  direct vision. Throughout the                            procedure, the patient's blood pressure, pulse, and                            oxygen saturations were monitored continuously. The                            [id number] scope was introduced through                            the anus and advanced to the the cecum, identified                            by appendiceal orifice and ileocecal valve. The                            ileocecal valve, appendiceal orifice, and rectum                            were photographed. Scope withdrawal time was 5                            minutes. The colonoscopy was performed without                            difficulty. The patient tolerated the procedure                            well. The quality of the bowel preparation was                            adequate. Scope In: 3:07:05 PM Scope Out: 3:24:53 PM Scope Withdrawal Time: 0 hours 12 minutes 32 seconds  Total Procedure Duration: 0 hours 17 minutes 48 seconds  Findings:      Two sessile polyps were found in the sigmoid colon and cecum. The polyps       were 3 mm in  size. Estimated blood loss was minimal. Estimated blood       loss was minimal. These polyps were removed with a cold snare. Resection       was complete, and retrieval was complete.      A few medium-mouthed diverticula were found in the sigmoid colon. There       was no evidence of diverticular bleeding. Impression:               - Four 4 to 5 mm polyps in the sigmoid colon and in                            the cecum, removed with a cold snare. Resected and                            retrieved. Moderate Sedation:      Moderate (conscious) sedation was administered by the endoscopy nurse       and supervised by the endoscopist. The following parameters were       monitored: oxygen saturation, heart rate, blood pressure, respiratory       rate, EKG, adequacy of pulmonary ventilation, and response to care.       Total  physician intraservice time was 29 minutes. Recommendation:           - Patient has a contact number available for                            emergencies. The signs and symptoms of potential                            delayed complications were discussed with the                            patient. Return to normal activities tomorrow.                            Written discharge instructions were provided to the                            patient.                           - Advance diet as tolerated today.                           - Continue present medications.                           - Await pathology results.                           - Repeat colonoscopy date to be determined after                            pending pathology results are reviewed for  surveillance based on pathology results. Procedure Code(s):        --- Professional ---                           [id number], Colonoscopy, flexible; with removal of                            tumor(s), polyp(s), or other lesion(s) by snare                            technique                           99152, Moderate sedation services provided by the                            same physician or other qualified health care                            professional performing the diagnostic or                            therapeutic service that the sedation supports,                            requiring the presence of an independent trained                            observer to assist in the monitoring of the                            patient's level of consciousness and physiological                            status; initial 15 minutes of intraservice time,                            patient age 36 years or older                           [phone], Moderate sedation services; each additional                            15 minutes intraservice time Diagnosis Code(s):        --- Professional ---                            Z12.11, Encounter for screening for malignant                            neoplasm of colon                           D12.5, Benign neoplasm of sigmoid colon  D12.0, Benign neoplasm of cecum CPT copyright 2016 American Medical Association. All rights reserved. The codes documented in this report are preliminary and upon coder review may  be revised to meet current compliance requirements. Cristopher Estimable. Misaki Sozio, MD Norvel Richards, MD 12/19/2015 3:38:07 PM This report has been signed electronically. Number of Addenda: 0

## 2015-12-23 ENCOUNTER — Encounter: Payer: Self-pay | Admitting: Internal Medicine

## 2015-12-25 ENCOUNTER — Encounter (HOSPITAL_COMMUNITY): Payer: Self-pay | Admitting: Internal Medicine

## 2016-04-06 ENCOUNTER — Encounter: Payer: Self-pay | Admitting: Internal Medicine

## 2016-04-22 ENCOUNTER — Encounter: Payer: Self-pay | Admitting: Family Medicine

## 2016-04-22 ENCOUNTER — Ambulatory Visit (INDEPENDENT_AMBULATORY_CARE_PROVIDER_SITE_OTHER): Payer: BLUE CROSS/BLUE SHIELD | Admitting: Family Medicine

## 2016-04-22 VITALS — BP 128/84 | Ht 68.75 in | Wt 211.8 lb

## 2016-04-22 DIAGNOSIS — Z79899 Other long term (current) drug therapy: Secondary | ICD-10-CM

## 2016-04-22 DIAGNOSIS — Z125 Encounter for screening for malignant neoplasm of prostate: Secondary | ICD-10-CM

## 2016-04-22 DIAGNOSIS — E785 Hyperlipidemia, unspecified: Secondary | ICD-10-CM

## 2016-04-22 DIAGNOSIS — I10 Essential (primary) hypertension: Secondary | ICD-10-CM

## 2016-04-22 MED ORDER — TRIAMCINOLONE ACETONIDE 0.1 % EX CREA: 1.0000 "application " | TOPICAL_CREAM | Freq: Two times a day (BID) | CUTANEOUS | 0 refills | Status: DC

## 2016-04-22 MED ORDER — ENALAPRIL MALEATE 10 MG PO TABS: 10.0000 mg | ORAL_TABLET | Freq: Every day | ORAL | 1 refills | Status: DC

## 2016-04-22 NOTE — Progress Notes (Signed)
   Subjective:    Patient ID: Ethan Walls, male    DOB: [birth year], 55 y.o.   MRN: [id number]  Hypertension  This is a chronic problem. The current episode started more than 1 year ago. Risk factors for coronary artery disease include male gender. Treatments tried: vasotec. There are no compliance problems.    Exercising reg  Blood pressure medicine and blood pressure levels reviewed today with patient. Compliant with blood pressure medicine. States does not miss a dose. No obvious side effects. Blood pressure generally good when checked elsewhere. Watching salt intake.     Review of Systems No headache, no major weight loss or weight gain, no chest pain no back pain abdominal pain no change in bowel habits complete ROS otherwise negative     Objective:   Physical Exam  Alert vitals stable lungs clear heart rare rhythm H&T normal blood pressure good on repeat heat rash-like eruption in intertrigo regions of anterior chest      Assessment & Plan:  Patient also notes rash worse during heat impression 1 hypertension good control #2 heat rash plan blood pressure meds refilled diet exercise discussed triamcinolone prescribed appropriate blood work recheck in 6 months for wellness wellness plus chronic

## 2016-04-23 ENCOUNTER — Ambulatory Visit: Payer: BLUE CROSS/BLUE SHIELD | Admitting: Family Medicine

## 2016-04-23 LAB — BASIC METABOLIC PANEL
BUN/Creatinine Ratio: 14 (ref 9–20)
BUN: 16 mg/dL (ref 6–24)
CHLORIDE: 102 mmol/L (ref 96–106)
CO2: 24 mmol/L (ref 18–29)
Calcium: 9.3 mg/dL (ref 8.7–10.2)
Creatinine, Ser: 1.12 mg/dL (ref 0.76–1.27)
GFR calc Af Amer: 84 mL/min/{1.73_m2} (ref >59)
GFR, EST NON AFRICAN AMERICAN: 73 mL/min/{1.73_m2} (ref >59)
Glucose: 93 mg/dL (ref 65–99)
POTASSIUM: 4.4 mmol/L (ref 3.5–5.2)
SODIUM: 141 mmol/L (ref 134–144)

## 2016-04-23 LAB — LIPID PANEL
CHOLESTEROL TOTAL: 184 mg/dL (ref 100–199)
Chol/HDL Ratio: 4.2 ratio units (ref 0.0–5.0)
HDL: 44 mg/dL (ref >39)
LDL CALC: 125 mg/dL — AB (ref 0–99)
TRIGLYCERIDES: 74 mg/dL (ref 0–149)
VLDL CHOLESTEROL CAL: 15 mg/dL (ref 5–40)

## 2016-04-23 LAB — HEPATIC FUNCTION PANEL
ALBUMIN: 4.4 g/dL (ref 3.5–5.5)
ALK PHOS: 59 IU/L (ref 39–117)
ALT: 44 IU/L (ref 0–44)
AST: 37 IU/L (ref 0–40)
Bilirubin Total: 0.5 mg/dL (ref 0.0–1.2)
Bilirubin, Direct: 0.14 mg/dL (ref 0.00–0.40)
Total Protein: 6.8 g/dL (ref 6.0–8.5)

## 2016-04-23 LAB — PSA: Prostate Specific Ag, Serum: 0.2 ng/mL (ref 0.0–4.0)

## 2016-04-27 ENCOUNTER — Encounter: Payer: Self-pay | Admitting: Family Medicine

## 2016-10-13 ENCOUNTER — Telehealth: Payer: Self-pay | Admitting: Family Medicine

## 2016-10-13 NOTE — Telephone Encounter (Signed)
Patient notified per Dr Richardson Landry due for his six mo f /u on hypertension along with wellness,  b/ w six mo ago, no need to repeat now. Patient verbalized understanding.

## 2016-10-13 NOTE — Telephone Encounter (Signed)
Pt due for si x mo f u on hypertension along with wellness,  b w six mo ago, no need to repeat now

## 2016-10-13 NOTE — Telephone Encounter (Signed)
Patient has physical 2/7 and needing lab work done.

## 2016-11-04 ENCOUNTER — Ambulatory Visit (INDEPENDENT_AMBULATORY_CARE_PROVIDER_SITE_OTHER): Payer: BLUE CROSS/BLUE SHIELD | Admitting: Family Medicine

## 2016-11-04 ENCOUNTER — Encounter: Payer: Self-pay | Admitting: Family Medicine

## 2016-11-04 VITALS — BP 130/82 | Ht 68.75 in | Wt 218.6 lb

## 2016-11-04 DIAGNOSIS — Z23 Encounter for immunization: Secondary | ICD-10-CM | POA: Diagnosis not present

## 2016-11-04 DIAGNOSIS — N400 Enlarged prostate without lower urinary tract symptoms: Secondary | ICD-10-CM | POA: Diagnosis not present

## 2016-11-04 DIAGNOSIS — I10 Essential (primary) hypertension: Secondary | ICD-10-CM

## 2016-11-04 DIAGNOSIS — Z0000 Encounter for general adult medical examination without abnormal findings: Secondary | ICD-10-CM | POA: Diagnosis not present

## 2016-11-04 MED ORDER — ENALAPRIL MALEATE 10 MG PO TABS: 10.0000 mg | ORAL_TABLET | Freq: Every day | ORAL | 1 refills | Status: DC

## 2016-11-04 MED ORDER — TAMSULOSIN HCL 0.4 MG PO CAPS: 0.4000 mg | ORAL_CAPSULE | Freq: Every day | ORAL | 11 refills | Status: DC

## 2016-11-04 NOTE — Progress Notes (Signed)
Subjective:    Patient ID: Ethan Walls, male    DOB: [birth year], 56 y.o.   MRN: [id number]  HPI  The patient comes in today for a wellness visit.  Results for orders placed or performed in visit on 04/22/16  Lipid panel  Result Value Ref Range   Cholesterol, Total 184 100 - 199 mg/dL   Triglycerides 74 0 - 149 mg/dL   HDL 44 >39 mg/dL   VLDL Cholesterol Cal 15 5 - 40 mg/dL   LDL Calculated 125 (H) 0 - 99 mg/dL   Chol/HDL Ratio 4.2 0.0 - 5.0 ratio units  Hepatic function panel  Result Value Ref Range   Total Protein 6.8 6.0 - 8.5 g/dL   Albumin 4.4 3.5 - 5.5 g/dL   Bilirubin Total 0.5 0.0 - 1.2 mg/dL   Bilirubin, Direct 0.14 0.00 - 0.40 mg/dL   Alkaline Phosphatase 59 39 - 117 IU/L   AST 37 0 - 40 IU/L   ALT 44 0 - 44 IU/L  Basic metabolic panel  Result Value Ref Range   Glucose 93 65 - 99 mg/dL   BUN 16 6 - 24 mg/dL   Creatinine, Ser 1.12 0.76 - 1.27 mg/dL   GFR calc non Af Amer 73 >59 mL/min/1.73   GFR calc Af Amer 84 >59 mL/min/1.73   BUN/Creatinine Ratio 14 9 - 20   Sodium 141 134 - 144 mmol/L   Potassium 4.4 3.5 - 5.2 mmol/L   Chloride 102 96 - 106 mmol/L   CO2 24 18 - 29 mmol/L   Calcium 9.3 8.7 - 10.2 mg/dL  PSA  Result Value Ref Range   Prostate Specific Ag, Serum 0.2 0.0 - 4.0 ng/mL   Work busy   A review of their health history was completed.  A review of medications was also completed.  Any needed refills; yes  Eating habits: trying to eat healthy  Falls/  MVA accidents in past few months: none  Regular exercise: yes thru work, involved thru work, but not formal  Diet not the best  Specialist pt sees on regular basis: none Preventative health issues were discussed.   Additional concerns: none  Working every other wekejnd   Blood pressure medicine and blood pressure levels reviewed today with patient. Compliant with blood pressure medicine. States does not miss a dose. No obvious side effects. Blood pressure generally good when checked  elsewhere. Watching salt intake.  For the past year getting up several times per night to urinate diminished flow during the day  Review of Systems  Constitutional: Negative for activity change, appetite change and fever.  HENT: Negative for congestion and rhinorrhea.   Eyes: Negative for discharge.  Respiratory: Negative for cough and wheezing.   Cardiovascular: Negative for chest pain.  Gastrointestinal: Negative for abdominal pain, blood in stool and vomiting.  Genitourinary: Negative for difficulty urinating and frequency.  Musculoskeletal: Negative for neck pain.  Skin: Negative for rash.  Allergic/Immunologic: Negative for environmental allergies and food allergies.  Neurological: Negative for weakness and headaches.  Psychiatric/Behavioral: Negative for agitation.  All other systems reviewed and are negative.      Objective:   Physical Exam  Constitutional: He appears well-developed and well-nourished.  HENT:  Head: Normocephalic and atraumatic.  Right Ear: External ear normal.  Left Ear: External ear normal.  Nose: Nose normal.  Mouth/Throat: Oropharynx is clear and moist.  Eyes: EOM are normal. Pupils are equal, round, and reactive to light.  Neck: Normal range of  motion. Neck supple. No thyromegaly present.  Cardiovascular: Normal rate, regular rhythm and normal heart sounds.   No murmur heard. Pulmonary/Chest: Effort normal and breath sounds normal. No respiratory distress. He has no wheezes.  Abdominal: Soft. Bowel sounds are normal. He exhibits no distension and no mass. There is no tenderness.  Genitourinary: Penis normal.  Genitourinary Comments: Diffuse enlargement prostate gland smooth normal consistency  Musculoskeletal: Normal range of motion. He exhibits no edema.  Lymphadenopathy:    He has no cervical adenopathy.  Neurological: He is alert. He exhibits normal muscle tone.  Skin: Skin is warm and dry. No erythema.  Psychiatric: He has a normal mood and  affect. His behavior is normal. Judgment normal.  Vitals reviewed.         Assessment & Plan:  Impression 1 wellness exam diet exercise discussed encourage him to hypertension control fair today not Kellie Simmering. Discussed not enough to change meds 142/88 on repeat both arms. #3 prostate hypertrophy discussed plan initiate Flomax 0.4 daily at bedtime. Maintain same dose of enalapril. Diet exercise discussed encourage. Check a few blood pressures at work record and bring at follow-up visit up-to-date on colonoscopy

## 2017-01-10 DIAGNOSIS — H40009 Preglaucoma, unspecified, unspecified eye: Secondary | ICD-10-CM | POA: Diagnosis not present

## 2017-05-05 ENCOUNTER — Encounter: Payer: Self-pay | Admitting: Family Medicine

## 2017-05-05 ENCOUNTER — Ambulatory Visit (INDEPENDENT_AMBULATORY_CARE_PROVIDER_SITE_OTHER): Payer: BLUE CROSS/BLUE SHIELD | Admitting: Family Medicine

## 2017-05-05 VITALS — BP 122/86 | Ht 68.75 in | Wt 213.0 lb

## 2017-05-05 DIAGNOSIS — Z13220 Encounter for screening for lipoid disorders: Secondary | ICD-10-CM | POA: Diagnosis not present

## 2017-05-05 DIAGNOSIS — I10 Essential (primary) hypertension: Secondary | ICD-10-CM

## 2017-05-05 DIAGNOSIS — E785 Hyperlipidemia, unspecified: Secondary | ICD-10-CM | POA: Diagnosis not present

## 2017-05-05 DIAGNOSIS — Z79899 Other long term (current) drug therapy: Secondary | ICD-10-CM | POA: Diagnosis not present

## 2017-05-05 DIAGNOSIS — Z125 Encounter for screening for malignant neoplasm of prostate: Secondary | ICD-10-CM | POA: Diagnosis not present

## 2017-05-05 MED ORDER — ENALAPRIL MALEATE 10 MG PO TABS: 10.0000 mg | ORAL_TABLET | Freq: Every day | ORAL | 1 refills | Status: DC

## 2017-05-05 NOTE — Progress Notes (Signed)
   Subjective:    Patient ID: Ethan Walls, male    DOB: 12-25-1960, 56 y.o.   MRN: [id number]  HPI    Review of Systems     Objective:   Physical Exam        Assessment & Plan:

## 2017-05-05 NOTE — Progress Notes (Signed)
   Subjective:    Patient ID: Ethan Walls, male    DOB: [birth year], 56 y.o.   MRN: [id number]  Hypertension  This is a chronic problem. Treatments tried: enalapril.   Eats healthy and exercises. No concerns.  Results for orders placed or performed in visit on 04/22/16  Lipid panel  Result Value Ref Range   Cholesterol, Total 184 100 - 199 mg/dL   Triglycerides 74 0 - 149 mg/dL   HDL 44 >39 mg/dL   VLDL Cholesterol Cal 15 5 - 40 mg/dL   LDL Calculated 125 (H) 0 - 99 mg/dL   Chol/HDL Ratio 4.2 0.0 - 5.0 ratio units  Hepatic function panel  Result Value Ref Range   Total Protein 6.8 6.0 - 8.5 g/dL   Albumin 4.4 3.5 - 5.5 g/dL   Bilirubin Total 0.5 0.0 - 1.2 mg/dL   Bilirubin, Direct 0.14 0.00 - 0.40 mg/dL   Alkaline Phosphatase 59 39 - 117 IU/L   AST 37 0 - 40 IU/L   ALT 44 0 - 44 IU/L  Basic metabolic panel  Result Value Ref Range   Glucose 93 65 - 99 mg/dL   BUN 16 6 - 24 mg/dL   Creatinine, Ser 1.12 0.76 - 1.27 mg/dL   GFR calc non Af Amer 73 >59 mL/min/1.73   GFR calc Af Amer 84 >59 mL/min/1.73   BUN/Creatinine Ratio 14 9 - 20   Sodium 141 134 - 144 mmol/L   Potassium 4.4 3.5 - 5.2 mmol/L   Chloride 102 96 - 106 mmol/L   CO2 24 18 - 29 mmol/L   Calcium 9.3 8.7 - 10.2 mg/dL  PSA  Result Value Ref Range   Prostate Specific Ag, Serum 0.2 0.0 - 4.0 ng/mL   Left arm, sees a derm in [place]. Has a spot, please submit concerned about  Blood pressure medicine and blood pressure levels reviewed today with patient. Compliant with blood pressure medicine. States does not miss a dose. No obvious side effects. Blood pressure generally good when checked elsewhere. Watching salt intake.     Review of Systems  No headache, no major weight loss or weight gain, no chest pain no back pain abdominal pain no change in bowel habits complete ROS otherwise negative     Objective:   Physical Exam Alert vitals stable, NAD. Blood pressure good on repeat. HEENT normal. Lungs clear.  Heart regular rate and rhythm. Nonspecific scaly irritated region forearm      Assessment & Plan:  Impression 1 hypertension good control discussed maintain same meds. Blood work reviewed. And ordered #2 skin lesion since sees dermatologist generally regularly encouraged to get back to his dermatologist for this diet exercise discussed. Follow-up in 6 months for wellness plus chronic

## 2017-05-06 LAB — BASIC METABOLIC PANEL
BUN / CREAT RATIO: 16 (ref 9–20)
BUN: 19 mg/dL (ref 6–24)
CHLORIDE: 105 mmol/L (ref 96–106)
CO2: 23 mmol/L (ref 20–29)
Calcium: 9.3 mg/dL (ref 8.7–10.2)
Creatinine, Ser: 1.19 mg/dL (ref 0.76–1.27)
GFR, EST AFRICAN AMERICAN: 77 mL/min/{1.73_m2} (ref >59)
GFR, EST NON AFRICAN AMERICAN: 67 mL/min/{1.73_m2} (ref >59)
Glucose: 93 mg/dL (ref 65–99)
Potassium: 5.3 mmol/L — ABNORMAL HIGH (ref 3.5–5.2)
Sodium: 142 mmol/L (ref 134–144)

## 2017-05-06 LAB — HEPATIC FUNCTION PANEL
ALT: 36 IU/L (ref 0–44)
AST: 23 IU/L (ref 0–40)
Albumin: 4.5 g/dL (ref 3.5–5.5)
Alkaline Phosphatase: 62 IU/L (ref 39–117)
BILIRUBIN TOTAL: 0.5 mg/dL (ref 0.0–1.2)
BILIRUBIN, DIRECT: 0.16 mg/dL (ref 0.00–0.40)
Total Protein: 7 g/dL (ref 6.0–8.5)

## 2017-05-06 LAB — LIPID PANEL
CHOL/HDL RATIO: 4.6 ratio (ref 0.0–5.0)
Cholesterol, Total: 201 mg/dL — ABNORMAL HIGH (ref 100–199)
HDL: 44 mg/dL (ref >39)
LDL Calculated: 133 mg/dL — ABNORMAL HIGH (ref 0–99)
TRIGLYCERIDES: 120 mg/dL (ref 0–149)
VLDL Cholesterol Cal: 24 mg/dL (ref 5–40)

## 2017-05-06 LAB — PSA: Prostate Specific Ag, Serum: 0.2 ng/mL (ref 0.0–4.0)

## 2017-05-12 ENCOUNTER — Encounter: Payer: Self-pay | Admitting: Family Medicine

## 2017-11-03 ENCOUNTER — Other Ambulatory Visit: Payer: Self-pay | Admitting: Family Medicine

## 2017-11-04 ENCOUNTER — Telehealth: Payer: Self-pay | Admitting: Family Medicine

## 2017-11-04 NOTE — Telephone Encounter (Signed)
Patient needing labs for appointment on 2/12.Please leave message when he can go get them done.

## 2017-11-04 NOTE — Telephone Encounter (Signed)
None needed

## 2017-11-04 NOTE — Telephone Encounter (Signed)
Patient advised that he does not need any blood work at this time. Patient verbalized understanding.

## 2017-11-04 NOTE — Telephone Encounter (Signed)
Patient had labs 04/2017- Lipid, Liver, Met 7 and PSA

## 2017-11-09 ENCOUNTER — Encounter: Payer: Self-pay | Admitting: Family Medicine

## 2017-11-09 ENCOUNTER — Ambulatory Visit: Payer: BLUE CROSS/BLUE SHIELD | Admitting: Family Medicine

## 2017-11-09 VITALS — BP 132/94 | Ht 68.75 in | Wt 213.0 lb

## 2017-11-09 DIAGNOSIS — Z0000 Encounter for general adult medical examination without abnormal findings: Secondary | ICD-10-CM

## 2017-11-09 DIAGNOSIS — I10 Essential (primary) hypertension: Secondary | ICD-10-CM

## 2017-11-09 MED ORDER — ENALAPRIL MALEATE 10 MG PO TABS: 10.0000 mg | ORAL_TABLET | Freq: Every day | ORAL | 1 refills | Status: DC

## 2017-11-09 NOTE — Progress Notes (Signed)
   Subjective:    Patient ID: Ethan Walls, male    DOB: 28-Apr-1961, 57 y.o.   MRN: [id number]  HPI The patient comes in today for a wellness visit.    A review of their health history was completed.  A review of medications was also completed.  Any needed refills; none  Eating habits: tries to eat healthy  Falls/  MVA accidents in past few months: none  Regular exercise: walks some  Specialist pt sees on regular basis: dermatology every couple of years  Preventative health issues were discussed.   Additional concerns: none  Blood pressure medicine and blood pressure levels reviewed today with patient. Compliant with blood pressure medicine. States does not miss a dose. No obvious side effects. Blood pressure generally good when checked elsewhere. Watching salt intake.  doesnot ck b p  Moving all day long, stays active and walking a lot  Diet mostly decent    Working on fats in the diet    Review of Systems  Constitutional: Negative for activity change, appetite change and fever.  HENT: Negative for congestion and rhinorrhea.   Eyes: Negative for discharge.  Respiratory: Negative for cough and wheezing.   Cardiovascular: Negative for chest pain.  Gastrointestinal: Negative for abdominal pain, blood in stool and vomiting.  Genitourinary: Negative for difficulty urinating and frequency.  Musculoskeletal: Negative for neck pain.  Skin: Negative for rash.  Allergic/Immunologic: Negative for environmental allergies and food allergies.  Neurological: Negative for weakness and headaches.  Psychiatric/Behavioral: Negative for agitation.  All other systems reviewed and are negative.      Objective:   Physical Exam  Constitutional: He appears well-developed and well-nourished.  HENT:  Head: Normocephalic and atraumatic.  Right Ear: External ear normal.  Left Ear: External ear normal.  Nose: Nose normal.  Mouth/Throat: Oropharynx is clear and moist.  Eyes: Right  eye exhibits no discharge. Left eye exhibits no discharge. No scleral icterus.  Neck: Normal range of motion. Neck supple. No thyromegaly present.  Cardiovascular: Normal rate, regular rhythm and normal heart sounds.  No murmur heard. Pulmonary/Chest: Effort normal and breath sounds normal. No respiratory distress. He has no wheezes.  Abdominal: Soft. Bowel sounds are normal. He exhibits no distension and no mass. There is no tenderness.  Genitourinary: Penis normal.  Musculoskeletal: Normal range of motion. He exhibits no edema.  Lymphadenopathy:    He has no cervical adenopathy.  Neurological: He is alert. He exhibits normal muscle tone. Coordination normal.  Skin: Skin is warm and dry. No erythema.  Psychiatric: He has a normal mood and affect. His behavior is normal. Judgment normal.  Vitals reviewed.         Assessment & Plan:  Colonoscopy in 17, next ne due in 2022  Wellness exam.  Diet discussed.  Exercise discussed.  Vaccines discussed.  Up-to-date on colonoscopy.  Mental health reviewed.  2.  Hypertension.  Good control apparent overall.  Missed last night's dose and took this morning.  Generally compliant tolerating medications well.  Blood pressures.  Dose.  Meds refilled for 6 months

## 2017-11-09 NOTE — Patient Instructions (Signed)

## 2018-05-09 ENCOUNTER — Ambulatory Visit: Payer: BLUE CROSS/BLUE SHIELD | Admitting: Family Medicine

## 2018-05-09 ENCOUNTER — Encounter: Payer: Self-pay | Admitting: Family Medicine

## 2018-05-09 VITALS — BP 130/86 | Ht 68.75 in | Wt 214.2 lb

## 2018-05-09 DIAGNOSIS — Z23 Encounter for immunization: Secondary | ICD-10-CM

## 2018-05-09 DIAGNOSIS — Z125 Encounter for screening for malignant neoplasm of prostate: Secondary | ICD-10-CM | POA: Diagnosis not present

## 2018-05-09 DIAGNOSIS — I10 Essential (primary) hypertension: Secondary | ICD-10-CM | POA: Diagnosis not present

## 2018-05-09 MED ORDER — ENALAPRIL MALEATE 10 MG PO TABS: 10.0000 mg | ORAL_TABLET | Freq: Every day | ORAL | 1 refills | Status: DC

## 2018-05-09 NOTE — Progress Notes (Signed)
   Subjective:    Patient ID: Ethan Walls, male    DOB: 11/27/1960, 57 y.o.   MRN: [id number]  Hypertension  This is a chronic problem. The current episode started more than 1 year ago. Risk factors for coronary artery disease include male gender. Treatments tried: vasotec. There are no compliance problems.   Blood pressure medicine and blood pressure levels reviewed today with patient. Compliant with blood pressure medicine. States does not miss a dose. No obvious side effects. Blood pressure generally good when checked elsewhere. Watching salt intake.  Results for orders placed or performed in visit on 05/05/17  Lipid panel  Result Value Ref Range   Cholesterol, Total 201 (H) 100 - 199 mg/dL   Triglycerides 120 0 - 149 mg/dL   HDL 44 >39 mg/dL   VLDL Cholesterol Cal 24 5 - 40 mg/dL   LDL Calculated 133 (H) 0 - 99 mg/dL   Chol/HDL Ratio 4.6 0.0 - 5.0 ratio  Hepatic function panel  Result Value Ref Range   Total Protein 7.0 6.0 - 8.5 g/dL   Albumin 4.5 3.5 - 5.5 g/dL   Bilirubin Total 0.5 0.0 - 1.2 mg/dL   Bilirubin, Direct 0.16 0.00 - 0.40 mg/dL   Alkaline Phosphatase 62 39 - 117 IU/L   AST 23 0 - 40 IU/L   ALT 36 0 - 44 IU/L  Basic metabolic panel  Result Value Ref Range   Glucose 93 65 - 99 mg/dL   BUN 19 6 - 24 mg/dL   Creatinine, Ser 1.19 0.76 - 1.27 mg/dL   GFR calc non Af Amer 67 >59 mL/min/1.73   GFR calc Af Amer 77 >59 mL/min/1.73   BUN/Creatinine Ratio 16 9 - 20   Sodium 142 134 - 144 mmol/L   Potassium 5.3 (H) 3.5 - 5.2 mmol/L   Chloride 105 96 - 106 mmol/L   CO2 23 20 - 29 mmol/L   Calcium 9.3 8.7 - 10.2 mg/dL  PSA  Result Value Ref Range   Prostate Specific Ag, Serum 0.2 0.0 - 4.0 ng/mL   States doing so so with diet  Exercise pretty sig with work        Review of Systems No headache, no major weight loss or weight gain, no chest pain no back pain abdominal pain no change in bowel habits complete ROS otherwise negative     Objective:   Physical  Exam Alert vitals stable, NAD. Blood pressure good on repeat. HEENT normal. Lungs clear. Heart regular rate and rhythm.        Assessment & Plan:  Impression hypertension.  Good control discussed maintain same meds compliance discussed  Follow-up in 6 months for wellness plus chronic

## 2018-05-10 ENCOUNTER — Encounter: Payer: Self-pay | Admitting: Family Medicine

## 2018-05-10 LAB — LIPID PANEL
CHOLESTEROL TOTAL: 186 mg/dL (ref 100–199)
Chol/HDL Ratio: 4.7 ratio (ref 0.0–5.0)
HDL: 40 mg/dL (ref >39)
LDL Calculated: 129 mg/dL — ABNORMAL HIGH (ref 0–99)
TRIGLYCERIDES: 86 mg/dL (ref 0–149)
VLDL Cholesterol Cal: 17 mg/dL (ref 5–40)

## 2018-05-10 LAB — BASIC METABOLIC PANEL
BUN/Creatinine Ratio: 15 (ref 9–20)
BUN: 18 mg/dL (ref 6–24)
CALCIUM: 8.9 mg/dL (ref 8.7–10.2)
CO2: 23 mmol/L (ref 20–29)
Chloride: 106 mmol/L (ref 96–106)
Creatinine, Ser: 1.2 mg/dL (ref 0.76–1.27)
GFR calc Af Amer: 76 mL/min/{1.73_m2} (ref >59)
GFR calc non Af Amer: 66 mL/min/{1.73_m2} (ref >59)
GLUCOSE: 99 mg/dL (ref 65–99)
POTASSIUM: 5.2 mmol/L (ref 3.5–5.2)
Sodium: 141 mmol/L (ref 134–144)

## 2018-05-10 LAB — HEPATIC FUNCTION PANEL
ALT: 33 IU/L (ref 0–44)
AST: 26 IU/L (ref 0–40)
Albumin: 4.3 g/dL (ref 3.5–5.5)
Alkaline Phosphatase: 60 IU/L (ref 39–117)
BILIRUBIN, DIRECT: 0.15 mg/dL (ref 0.00–0.40)
Bilirubin Total: 0.5 mg/dL (ref 0.0–1.2)
Total Protein: 6.5 g/dL (ref 6.0–8.5)

## 2018-05-10 LAB — PSA: PROSTATE SPECIFIC AG, SERUM: 0.2 ng/mL (ref 0.0–4.0)

## 2018-07-05 ENCOUNTER — Other Ambulatory Visit: Payer: Self-pay | Admitting: Dermatology

## 2018-07-05 DIAGNOSIS — C44619 Basal cell carcinoma of skin of left upper limb, including shoulder: Secondary | ICD-10-CM | POA: Diagnosis not present

## 2018-07-05 DIAGNOSIS — C4491 Basal cell carcinoma of skin, unspecified: Secondary | ICD-10-CM

## 2018-07-05 DIAGNOSIS — D485 Neoplasm of uncertain behavior of skin: Secondary | ICD-10-CM | POA: Diagnosis not present

## 2018-07-05 DIAGNOSIS — D044 Carcinoma in situ of skin of scalp and neck: Secondary | ICD-10-CM | POA: Diagnosis not present

## 2018-07-05 DIAGNOSIS — C44519 Basal cell carcinoma of skin of other part of trunk: Secondary | ICD-10-CM | POA: Diagnosis not present

## 2018-07-05 DIAGNOSIS — L570 Actinic keratosis: Secondary | ICD-10-CM | POA: Diagnosis not present

## 2018-07-05 DIAGNOSIS — C4492 Squamous cell carcinoma of skin, unspecified: Secondary | ICD-10-CM

## 2018-07-05 DIAGNOSIS — D229 Melanocytic nevi, unspecified: Secondary | ICD-10-CM | POA: Diagnosis not present

## 2018-07-05 DIAGNOSIS — D0462 Carcinoma in situ of skin of left upper limb, including shoulder: Secondary | ICD-10-CM | POA: Diagnosis not present

## 2018-07-05 HISTORY — DX: Squamous cell carcinoma of skin, unspecified: C44.92

## 2018-07-05 HISTORY — DX: Basal cell carcinoma of skin, unspecified: C44.91

## 2018-08-11 DIAGNOSIS — L570 Actinic keratosis: Secondary | ICD-10-CM | POA: Diagnosis not present

## 2018-08-11 DIAGNOSIS — D044 Carcinoma in situ of skin of scalp and neck: Secondary | ICD-10-CM | POA: Diagnosis not present

## 2018-08-11 DIAGNOSIS — C44519 Basal cell carcinoma of skin of other part of trunk: Secondary | ICD-10-CM | POA: Diagnosis not present

## 2018-08-11 DIAGNOSIS — D0462 Carcinoma in situ of skin of left upper limb, including shoulder: Secondary | ICD-10-CM | POA: Diagnosis not present

## 2018-08-11 DIAGNOSIS — C4441 Basal cell carcinoma of skin of scalp and neck: Secondary | ICD-10-CM | POA: Diagnosis not present

## 2018-08-11 DIAGNOSIS — C44319 Basal cell carcinoma of skin of other parts of face: Secondary | ICD-10-CM | POA: Diagnosis not present

## 2018-09-12 DIAGNOSIS — L570 Actinic keratosis: Secondary | ICD-10-CM | POA: Diagnosis not present

## 2018-09-27 ENCOUNTER — Ambulatory Visit: Payer: BLUE CROSS/BLUE SHIELD | Admitting: Family Medicine

## 2018-09-27 ENCOUNTER — Encounter: Payer: Self-pay | Admitting: Family Medicine

## 2018-09-27 VITALS — BP 130/90 | Temp 99.1°F | Wt 220.6 lb

## 2018-09-27 DIAGNOSIS — J019 Acute sinusitis, unspecified: Secondary | ICD-10-CM | POA: Diagnosis not present

## 2018-09-27 MED ORDER — AMOXICILLIN 500 MG PO CAPS: 500.0000 mg | ORAL_CAPSULE | Freq: Three times a day (TID) | ORAL | 0 refills | Status: DC

## 2018-09-27 NOTE — Progress Notes (Signed)
   Subjective:    Patient ID: Ethan Walls, male    DOB: 01-07-1961, 57 y.o.   MRN: [id number]  Cough  This is a new problem. The current episode started in the past 7 days. The cough is non-productive. Associated symptoms include chills, headaches, nasal congestion, rhinorrhea and a sore throat. Pertinent negatives include no ear pain, fever, shortness of breath or wheezing. Associated symptoms comments: Stuffy head. Treatments tried: Tylenol, Mucinex. The treatment provided mild relief.   Symptoms started about 7-8 days ago with sore throat, progressed to congestion and cough. Cough productive of clear/yellow sputum. No known fever. Reports frontal sinus pressure, no ear pain. Denies body aches.     Review of Systems  Constitutional: Positive for chills. Negative for fever.  HENT: Positive for congestion, rhinorrhea, sinus pressure and sore throat. Negative for ear pain.   Respiratory: Positive for cough. Negative for shortness of breath and wheezing.   Gastrointestinal: Negative for diarrhea, nausea and vomiting.  Neurological: Positive for headaches.       Objective:   Physical Exam Vitals signs and nursing note reviewed.  Constitutional:      General: He is not in acute distress.    Appearance: Normal appearance. He is not toxic-appearing.  HENT:     Head: Normocephalic and atraumatic.     Right Ear: Tympanic membrane normal.     Left Ear: Tympanic membrane normal.     Nose: Congestion present. No nasal tenderness.     Mouth/Throat:     Mouth: Mucous membranes are moist.     Pharynx: Oropharynx is clear.  Eyes:     General:        Right eye: No discharge.        Left eye: No discharge.  Neck:     Musculoskeletal: Neck supple. No neck rigidity.  Cardiovascular:     Rate and Rhythm: Normal rate and regular rhythm.     Heart sounds: Normal heart sounds.  Pulmonary:     Effort: Pulmonary effort is normal. No respiratory distress.     Breath sounds: Normal breath  sounds.  Lymphadenopathy:     Cervical: No cervical adenopathy.  Skin:    General: Skin is warm and dry.  Neurological:     Mental Status: He is alert and oriented to person, place, and time.           Assessment & Plan:  Acute rhinosinusitis  Discussed likely post-viral sinusitis. Will go ahead and treat with abx. Warning signs discussed, symptomatic care discussed. F/u if symptoms worsen or fail to improve.

## 2018-10-19 ENCOUNTER — Telehealth: Payer: Self-pay | Admitting: Family Medicine

## 2018-10-19 DIAGNOSIS — I10 Essential (primary) hypertension: Secondary | ICD-10-CM

## 2018-10-19 DIAGNOSIS — Z79899 Other long term (current) drug therapy: Secondary | ICD-10-CM

## 2018-10-19 DIAGNOSIS — E785 Hyperlipidemia, unspecified: Secondary | ICD-10-CM

## 2018-10-19 NOTE — Telephone Encounter (Signed)
Blood work ordered in Epic. Patient notified. 

## 2018-10-19 NOTE — Telephone Encounter (Signed)
Pt has CPE scheduled for 11/22/18. He would like to have lab work done before coming to the appt.   CB# [phone]

## 2018-10-19 NOTE — Telephone Encounter (Signed)
Last labs 04/2018: Lipid, Liver, Met 7 and PSA 

## 2018-10-19 NOTE — Telephone Encounter (Signed)
Lip liv m7 

## 2018-11-03 DIAGNOSIS — E785 Hyperlipidemia, unspecified: Secondary | ICD-10-CM | POA: Diagnosis not present

## 2018-11-03 DIAGNOSIS — Z79899 Other long term (current) drug therapy: Secondary | ICD-10-CM | POA: Diagnosis not present

## 2018-11-03 DIAGNOSIS — I10 Essential (primary) hypertension: Secondary | ICD-10-CM | POA: Diagnosis not present

## 2018-11-04 LAB — BASIC METABOLIC PANEL
BUN/Creatinine Ratio: 14 (ref 9–20)
BUN: 15 mg/dL (ref 6–24)
CALCIUM: 9.4 mg/dL (ref 8.7–10.2)
CHLORIDE: 104 mmol/L (ref 96–106)
CO2: 23 mmol/L (ref 20–29)
Creatinine, Ser: 1.1 mg/dL (ref 0.76–1.27)
GFR calc Af Amer: 84 mL/min/{1.73_m2} (ref >59)
GFR calc non Af Amer: 73 mL/min/{1.73_m2} (ref >59)
GLUCOSE: 93 mg/dL (ref 65–99)
Potassium: 4.8 mmol/L (ref 3.5–5.2)
Sodium: 141 mmol/L (ref 134–144)

## 2018-11-04 LAB — LIPID PANEL
CHOL/HDL RATIO: 4.1 ratio (ref 0.0–5.0)
Cholesterol, Total: 203 mg/dL — ABNORMAL HIGH (ref 100–199)
HDL: 50 mg/dL (ref >39)
LDL CALC: 136 mg/dL — AB (ref 0–99)
TRIGLYCERIDES: 86 mg/dL (ref 0–149)
VLDL CHOLESTEROL CAL: 17 mg/dL (ref 5–40)

## 2018-11-04 LAB — HEPATIC FUNCTION PANEL
ALT: 39 IU/L (ref 0–44)
AST: 27 IU/L (ref 0–40)
Albumin: 4.6 g/dL (ref 3.8–4.9)
Alkaline Phosphatase: 64 IU/L (ref 39–117)
Bilirubin Total: 0.6 mg/dL (ref 0.0–1.2)
Bilirubin, Direct: 0.17 mg/dL (ref 0.00–0.40)
Total Protein: 7.1 g/dL (ref 6.0–8.5)

## 2018-11-22 ENCOUNTER — Ambulatory Visit (INDEPENDENT_AMBULATORY_CARE_PROVIDER_SITE_OTHER): Payer: BLUE CROSS/BLUE SHIELD | Admitting: Family Medicine

## 2018-11-22 ENCOUNTER — Encounter: Payer: Self-pay | Admitting: Family Medicine

## 2018-11-22 VITALS — BP 154/88 | Ht 69.5 in | Wt 217.6 lb

## 2018-11-22 DIAGNOSIS — I10 Essential (primary) hypertension: Secondary | ICD-10-CM

## 2018-11-22 DIAGNOSIS — Z0000 Encounter for general adult medical examination without abnormal findings: Secondary | ICD-10-CM

## 2018-11-22 DIAGNOSIS — E785 Hyperlipidemia, unspecified: Secondary | ICD-10-CM | POA: Diagnosis not present

## 2018-11-22 MED ORDER — ENALAPRIL MALEATE 20 MG PO TABS: 20.0000 mg | ORAL_TABLET | Freq: Every day | ORAL | 1 refills | Status: DC

## 2018-11-22 NOTE — Patient Instructions (Signed)
Preventing High Cholesterol  Cholesterol is a waxy, fat-like substance that your body needs in small amounts. Your liver makes all the cholesterol that your body needs. Having high cholesterol (hypercholesterolemia) increases your risk for heart disease and stroke. Extra (excess) cholesterol comes from the food you eat, such as animal-based fat (saturated fat) from meat and some dairy products.  High cholesterol can often be prevented with diet and lifestyle changes. If you already have high cholesterol, you can control it with diet and lifestyle changes, as well as medicine.  What nutrition changes can be made?   Eat less saturated fat. Foods that contain saturated fat include red meat and some dairy products.   Avoid processed meats, like bacon and lunch meats.   Avoid trans fats, which are found in margarine and some baked goods.   Avoid foods and beverages that have added sugars.   Eat more fruits, vegetables, and whole grains.   Choose healthy sources of protein, such as fish, poultry, and nuts.   Choose healthy sources of fat, such as:  ? Nuts.  ? Vegetable oils, especially olive oil.  ? Fish that have healthy fats (omega-3 fatty acids), such as mackerel or salmon.  What lifestyle changes can be made?     Lose weight if you are overweight. Losing 5-10 lb (2.3-4.5 kg) can help prevent or control high cholesterol and reduce your risk for diabetes and high blood pressure. Ask your health care provider to help you with a diet and exercise plan to safely lose weight.   Get enough exercise. Do at least 150 minutes of moderate-intensity exercise each week.  ? You could do this in short exercise sessions several times a day, or you could do longer exercise sessions a few times a week. For example, you could take a brisk 10-minute walk or bike ride, 3 times a day, for 5 days a week.   Do not smoke. If you need help quitting, ask your health care provider.   Limit your alcohol intake. If you drink  alcohol, limit alcohol intake to no more than 1 drink a day for nonpregnant women and 2 drinks a day for men. One drink equals 12 oz of beer, 5 oz of wine, or 1 oz of hard liquor.  Why are these changes important?    If you have high cholesterol, deposits (plaques) may build up on the walls of your blood vessels. Plaques make the arteries narrower and stiffer, which can restrict or block blood flow and cause blood clots to form. This greatly increases your risk for heart attack and stroke. Making diet and lifestyle changes can reduce your risk for these life-threatening conditions.  What can I do to lower my risk?   Manage your risk factors for high cholesterol. Talk with your health care provider about all of your risk factors and how to lower your risk.   Manage other conditions that you have, such as diabetes or high blood pressure (hypertension).   Have your cholesterol checked at regular intervals.   Keep all follow-up visits as told by your health care provider. This is important.  How is this treated?  In addition to diet and lifestyle changes, your health care provider may recommend medicines to help lower cholesterol, such as a medicine to reduce the amount of cholesterol made in your liver. You may need medicine if:   Diet and lifestyle changes do not lower your cholesterol enough.   You have high cholesterol and other risk factors   and Blood Institute: [email or web address] Summary  High cholesterol increases your risk for heart disease and stroke. By keeping your cholesterol level low, you can reduce your risk for these conditions.  Diet and lifestyle changes are  the most important steps in preventing high cholesterol.  Work with your health care provider to manage your risk factors, and have your blood tested regularly. This information is not intended to replace advice given to you by your health care provider. Make sure you discuss any questions you have with your health care provider. Document Released: 09/29/2015 Document Revised: 05/23/2016 Document Reviewed: 05/23/2016 Elsevier Interactive Patient Education  2019 [employer]. High Cholesterol  High cholesterol is a condition in which the blood has high levels of a white, waxy, fat-like substance (cholesterol). The human body needs small amounts of cholesterol. The liver makes all the cholesterol that the body needs. Extra (excess) cholesterol comes from the food that we eat. Cholesterol is carried from the liver by the blood through the blood vessels. If you have high cholesterol, deposits (plaques) may build up on the walls of your blood vessels (arteries). Plaques make the arteries narrower and stiffer. Cholesterol plaques increase your risk for heart attack and stroke. Work with your health care provider to keep your cholesterol levels in a healthy range. What increases the risk? This condition is more likely to develop in people who:  Eat foods that are high in animal fat (saturated fat) or cholesterol.  Are overweight.  Are not getting enough exercise.  Have a family history of high cholesterol. What are the signs or symptoms? There are no symptoms of this condition. How is this diagnosed? This condition may be diagnosed from the results of a blood test.  If you are older than age 20, your health care provider may check your cholesterol every 4-6 years.  You may be checked more often if you already have high cholesterol or other risk factors for heart disease. The blood test for cholesterol measures:  "Bad" cholesterol (LDL cholesterol). This is the main type of cholesterol that  causes heart disease. The desired level for LDL is less than 100.  "Good" cholesterol (HDL cholesterol). This type helps to protect against heart disease by cleaning the arteries and carrying the LDL away. The desired level for HDL is 60 or higher.  Triglycerides. These are fats that the body can store or burn for energy. The desired number for triglycerides is lower than 150.  Total cholesterol. This is a measure of the total amount of cholesterol in your blood, including LDL cholesterol, HDL cholesterol, and triglycerides. A healthy number is less than 200. How is this treated? This condition is treated with diet changes, lifestyle changes, and medicines. Diet changes  This may include eating more whole grains, fruits, vegetables, nuts, and fish.  This may also include cutting back on red meat and foods that have a lot of added sugar. Lifestyle changes  Changes may include getting at least 40 minutes of aerobic exercise 3 times a week. Aerobic exercises include walking, biking, and swimming. Aerobic exercise along with a healthy diet can help you maintain a healthy weight.  Changes may also include quitting smoking. Medicines  Medicines are usually given if diet and lifestyle changes have failed to reduce your cholesterol to healthy levels.  Your health care provider may prescribe a statin medicine. Statin medicines have been shown to reduce cholesterol, which can reduce the risk of heart disease. Follow these instructions at home: Eating and drinking If  told by your health care provider:  Eat chicken (without skin), fish, veal, shellfish, ground [place] breast, and round or loin cuts of red meat.  Do not eat fried foods or fatty meats, such as hot dogs and salami.  Eat plenty of fruits, such as apples.  Eat plenty of vegetables, such as broccoli, potatoes, and carrots.  Eat beans, peas, and lentils.  Eat grains such as barley, rice, couscous, and bulgur wheat.  Eat pasta  without cream sauces.  Use skim or nonfat milk, and eat low-fat or nonfat yogurt and cheeses.  Do not eat or drink whole milk, cream, ice cream, egg yolks, or hard cheeses.  Do not eat stick margarine or tub margarines that contain trans fats (also called partially hydrogenated oils).  Do not eat saturated tropical oils, such as coconut oil and palm oil.  Do not eat cakes, cookies, crackers, or other baked goods that contain trans fats.  General instructions  Exercise as directed by your health care provider. Increase your activity level with activities such as gardening, walking, and taking the stairs.  Take over-the-counter and prescription medicines only as told by your health care provider.  Do not use any products that contain nicotine or tobacco, such as cigarettes and e-cigarettes. If you need help quitting, ask your health care provider.  Keep all follow-up visits as told by your health care provider. This is important. Contact a health care provider if:  You are struggling to maintain a healthy diet or weight.  You need help to start on an exercise program.  You need help to stop smoking. Get help right away if:  You have chest pain.  You have trouble breathing. This information is not intended to replace advice given to you by your health care provider. Make sure you discuss any questions you have with your health care provider. Document Released: 09/14/2005 Document Revised: 04/11/2016 Document Reviewed: 03/14/2016 Elsevier Interactive Patient Education  [employer].

## 2018-11-22 NOTE — Progress Notes (Signed)
Subjective:    Patient ID: Ethan Walls, male    DOB: 1961-08-27, 58 y.o.   MRN: [id number]  HPI The patient comes in today for a wellness visit.    A review of their health history was completed.  A review of medications was also completed.  Any needed refills; yes  Eating habits: trying to eat healthy  Falls/  MVA accidents in past few months: no  Regular exercise: working regular, fish regularly  Specialist pt sees on regular basis: Dr tafeen  Preventative health issues were discussed.   Additional concerns: a couple of balance issues.last six months several bouts of imbalance/occurs monentarily, does not fall, momentary unsteadiness, feels momentarily loss of balance  'Blood pressure medicine and blood pressure levels reviewed today with patient. Compliant with blood pressure medicine. States does not miss a dose. No obvious side effects. Blood pressure generally good when checked elsewhere. Watching salt intake.     Results for orders placed or performed in visit on 10/19/18  Lipid panel  Result Value Ref Range   Cholesterol, Total 203 (H) 100 - 199 mg/dL   Triglycerides 86 0 - 149 mg/dL   HDL 50 >39 mg/dL   VLDL Cholesterol Cal 17 5 - 40 mg/dL   LDL Calculated 136 (H) 0 - 99 mg/dL   Chol/HDL Ratio 4.1 0.0 - 5.0 ratio  Hepatic function panel  Result Value Ref Range   Total Protein 7.1 6.0 - 8.5 g/dL   Albumin 4.6 3.8 - 4.9 g/dL   Bilirubin Total 0.6 0.0 - 1.2 mg/dL   Bilirubin, Direct 0.17 0.00 - 0.40 mg/dL   Alkaline Phosphatase 64 39 - 117 IU/L   AST 27 0 - 40 IU/L   ALT 39 0 - 44 IU/L  Basic metabolic panel  Result Value Ref Range   Glucose 93 65 - 99 mg/dL   BUN 15 6 - 24 mg/dL   Creatinine, Ser 1.10 0.76 - 1.27 mg/dL   GFR calc non Af Amer 73 >59 mL/min/1.73   GFR calc Af Amer 84 >59 mL/min/1.73   BUN/Creatinine Ratio 14 9 - 20   Sodium 141 134 - 144 mmol/L   Potassium 4.8 3.5 - 5.2 mmol/L   Chloride 104 96 - 106 mmol/L   CO2 23 20 - 29  mmol/L   Calcium 9.4 8.7 - 10.2 mg/dL    Father had a stroke in his 14s  pts fa smoked  Review of Systems  Constitutional: Negative for activity change, appetite change and fever.  HENT: Negative for congestion and rhinorrhea.   Eyes: Negative for discharge.  Respiratory: Negative for cough and wheezing.   Cardiovascular: Negative for chest pain.  Gastrointestinal: Negative for abdominal pain, blood in stool and vomiting.  Genitourinary: Negative for difficulty urinating and frequency.  Musculoskeletal: Negative for neck pain.  Skin: Negative for rash.  Allergic/Immunologic: Negative for environmental allergies and food allergies.  Neurological: Negative for weakness and headaches.  Psychiatric/Behavioral: Negative for agitation.  All other systems reviewed and are negative.      Objective:   Physical Exam Vitals signs reviewed.  Constitutional:      Appearance: He is well-developed.  HENT:     Head: Normocephalic and atraumatic.     Right Ear: External ear normal.     Left Ear: External ear normal.     Nose: Nose normal.  Eyes:     Pupils: Pupils are equal, round, and reactive to light.  Neck:     Musculoskeletal:  Normal range of motion and neck supple.     Thyroid: No thyromegaly.  Cardiovascular:     Rate and Rhythm: Normal rate and regular rhythm.     Heart sounds: Normal heart sounds. No murmur.  Pulmonary:     Effort: Pulmonary effort is normal. No respiratory distress.     Breath sounds: Normal breath sounds. No wheezing.  Abdominal:     General: Bowel sounds are normal. There is no distension.     Palpations: Abdomen is soft. There is no mass.     Tenderness: There is no abdominal tenderness.  Genitourinary:    Penis: Normal.   Musculoskeletal: Normal range of motion.  Lymphadenopathy:     Cervical: No cervical adenopathy.  Skin:    General: Skin is warm and dry.     Findings: No erythema.  Neurological:     Mental Status: He is alert.     Motor: No  abnormal muscle tone.  Psychiatric:        Behavior: Behavior normal.        Judgment: Judgment normal.    Neuro exam intact.  No focal neurological deficits.  No cerebellar findings       Assessment & Plan:  Impression wellness exam.  Diet discussed.  Exercise discussed.  Vaccines discussed and encouraged.  Up-to-date on colonoscopy.  Low-dose aspirin discussed and recommended due to [facility] recommendations  2.  Transient unsteadiness.  Likely vertigo.  Likely inner ear etiology discussed  3.  Hypertension.  Decent control repeat to maintain same discussed  4.  Hyperlipidemia.  Time to be thinking about medication.  Discussed.  Patient would like to work harder on his diet

## 2018-12-05 DIAGNOSIS — L570 Actinic keratosis: Secondary | ICD-10-CM | POA: Diagnosis not present

## 2019-05-10 ENCOUNTER — Ambulatory Visit: Payer: BLUE CROSS/BLUE SHIELD

## 2019-05-23 ENCOUNTER — Ambulatory Visit: Payer: BLUE CROSS/BLUE SHIELD | Admitting: Family Medicine

## 2019-05-24 ENCOUNTER — Ambulatory Visit: Payer: BC Managed Care – PPO | Admitting: Family Medicine

## 2019-05-24 ENCOUNTER — Other Ambulatory Visit: Payer: Self-pay

## 2019-05-24 ENCOUNTER — Telehealth: Payer: Self-pay | Admitting: Family Medicine

## 2019-05-24 MED ORDER — ENALAPRIL MALEATE 20 MG PO TABS: 20.0000 mg | ORAL_TABLET | Freq: Every day | ORAL | 0 refills | Status: DC

## 2019-05-24 NOTE — Progress Notes (Unsigned)
   Subjective:    Patient ID: Ethan Walls, male    DOB: 1961/08/12, 58 y.o.   MRN: [id number]  Hypertension This is a chronic problem.      Review of Systems     Objective:   Physical Exam        Assessment & Plan:

## 2019-05-24 NOTE — Telephone Encounter (Signed)
Patient is requesting refill on enalapril 20 mg has rescheduled appointment for 9/3. [facility]

## 2019-05-24 NOTE — Telephone Encounter (Signed)
Prescription sent electronically to pharmacy. Left message to return call to notify patient. 

## 2019-05-26 NOTE — Telephone Encounter (Signed)
Patient notifed

## 2019-06-01 ENCOUNTER — Ambulatory Visit (INDEPENDENT_AMBULATORY_CARE_PROVIDER_SITE_OTHER): Payer: BC Managed Care – PPO | Admitting: Family Medicine

## 2019-06-01 ENCOUNTER — Other Ambulatory Visit: Payer: Self-pay

## 2019-06-01 ENCOUNTER — Encounter: Payer: Self-pay | Admitting: Family Medicine

## 2019-06-01 VITALS — BP 151/91 | Ht 69.5 in

## 2019-06-01 DIAGNOSIS — I10 Essential (primary) hypertension: Secondary | ICD-10-CM | POA: Diagnosis not present

## 2019-06-01 MED ORDER — ENALAPRIL MALEATE 20 MG PO TABS: 20.0000 mg | ORAL_TABLET | Freq: Every day | ORAL | 1 refills | Status: DC

## 2019-06-01 NOTE — Progress Notes (Signed)
   Subjective:    Patient ID: Ethan Walls, male    DOB: [birth year], 58 y.o.   MRN: [id number] Audio only  Patient calls with multiple concerns Hypertension This is a chronic problem. There are no compliance problems (takes enalapril 20mg  every night, eats healthy, does yard work for exercise).    Stomping something at work with his right heel about 3 weeks ago and has had heel pain since then. Tried ice.   Heel pain is worse in the morning time.  Very sensitive and tender.  Of note pain did not occur immediately upon stopping.  K 10 hours later.  No history of heel problems in the past.  Has taken no oral medicines  Virtual Visit via Telephone Note  I connected with Ethan Walls on 06/01/19 at  8:30 AM EDT by telephone and verified that I am speaking with the correct person using two identifiers.  Location: Patient: home Provider: office   I discussed the limitations, risks, security and privacy concerns of performing an evaluation and management service by telephone and the availability of in person appointments. I also discussed with the patient that there may be a patient responsible charge related to this service. The patient expressed understanding and agreed to proceed.   History of Present Illness:    Observations/Objective:   Assessment and Plan:   Follow Up Instructions:    I discussed the assessment and treatment plan with the patient. The patient was provided an opportunity to ask questions and all were answered. The patient agreed with the plan and demonstrated an understanding of the instructions.   The patient was advised to call back or seek an in-person evaluation if the symptoms worsen or if the condition fails to improve as anticipated.  I provided 18 minutes of non-face-to-face time during this encounter.    Blood pressure medicine and blood pressure levels reviewed today with patient. Compliant with blood pressure medicine. States does not miss a  dose. No obvious side effects. Blood pressure generally good when checked elsewhere. Watching salt intake.     Review of Systems .rs No headache, no major weight loss or weight gain, no chest pain no back pain abdominal pain no change in bowel habits complete ROS otherwise negative     Objective:   Physical Exam   Virtual     Assessment & Plan:  Impression hypertension.  Compliant with medications.  Had one elevated number.  Overall numbers have been good.  Watching salt intake.  Importance of compliance discussed  2.  Probable plantar fasciitis post heel injury.  Interventional measures discussed including anti-inflammatories localized heel cups  Follow-up in 6 months for wellness plus chronic

## 2019-11-02 ENCOUNTER — Encounter: Payer: Self-pay | Admitting: Family Medicine

## 2019-12-15 ENCOUNTER — Telehealth: Payer: Self-pay | Admitting: Family Medicine

## 2019-12-15 DIAGNOSIS — Z79899 Other long term (current) drug therapy: Secondary | ICD-10-CM

## 2019-12-15 DIAGNOSIS — Z125 Encounter for screening for malignant neoplasm of prostate: Secondary | ICD-10-CM

## 2019-12-15 DIAGNOSIS — I10 Essential (primary) hypertension: Secondary | ICD-10-CM

## 2019-12-15 DIAGNOSIS — E785 Hyperlipidemia, unspecified: Secondary | ICD-10-CM

## 2019-12-15 NOTE — Telephone Encounter (Signed)
Last labs 11/03/18 lipid, liver, bmp and see note below

## 2019-12-15 NOTE — Telephone Encounter (Signed)
Pt went fishing on Wednesday and found out yesterday that person has covid. He informed work and work has taken him out of work until 3/29. Pt has to go get tested for work on 3/25. If pt starts having symptoms before 3/25 they advised him to get tested. Pt currently is not having any symptoms but wanted to make Dr. Richardson Landry aware. He said [employer] will be sending paper work over so he may get paid while he is out of work.   Also pt has CPE scheduled 4/14 and would like lab work ordered.

## 2019-12-17 NOTE — Telephone Encounter (Signed)
Lip liv m7 psa 

## 2019-12-18 NOTE — Telephone Encounter (Signed)
Blood work ordered in Epic. Patient notified. 

## 2020-01-05 DIAGNOSIS — Z79899 Other long term (current) drug therapy: Secondary | ICD-10-CM | POA: Diagnosis not present

## 2020-01-05 DIAGNOSIS — E785 Hyperlipidemia, unspecified: Secondary | ICD-10-CM | POA: Diagnosis not present

## 2020-01-05 DIAGNOSIS — I10 Essential (primary) hypertension: Secondary | ICD-10-CM | POA: Diagnosis not present

## 2020-01-05 DIAGNOSIS — Z125 Encounter for screening for malignant neoplasm of prostate: Secondary | ICD-10-CM | POA: Diagnosis not present

## 2020-01-06 LAB — HEPATIC FUNCTION PANEL
ALT: 39 IU/L (ref 0–44)
AST: 32 IU/L (ref 0–40)
Albumin: 4.5 g/dL (ref 3.8–4.8)
Alkaline Phosphatase: 65 IU/L (ref 39–117)
Bilirubin Total: 0.6 mg/dL (ref 0.0–1.2)
Bilirubin, Direct: 0.17 mg/dL (ref 0.00–0.40)
Total Protein: 6.8 g/dL (ref 6.0–8.5)

## 2020-01-06 LAB — LIPID PANEL
Chol/HDL Ratio: 4 ratio (ref 0.0–5.0)
Cholesterol, Total: 178 mg/dL (ref 100–199)
HDL: 44 mg/dL (ref >39)
LDL Chol Calc (NIH): 118 mg/dL — ABNORMAL HIGH (ref 0–99)
Triglycerides: 84 mg/dL (ref 0–149)
VLDL Cholesterol Cal: 16 mg/dL (ref 5–40)

## 2020-01-06 LAB — BASIC METABOLIC PANEL
BUN/Creatinine Ratio: 13 (ref 10–24)
BUN: 15 mg/dL (ref 8–27)
CO2: 21 mmol/L (ref 20–29)
Calcium: 9.2 mg/dL (ref 8.6–10.2)
Chloride: 104 mmol/L (ref 96–106)
Creatinine, Ser: 1.17 mg/dL (ref 0.76–1.27)
GFR calc Af Amer: 77 mL/min/{1.73_m2} (ref >59)
GFR calc non Af Amer: 67 mL/min/{1.73_m2} (ref >59)
Glucose: 100 mg/dL — ABNORMAL HIGH (ref 65–99)
Potassium: 4.2 mmol/L (ref 3.5–5.2)
Sodium: 139 mmol/L (ref 134–144)

## 2020-01-06 LAB — PSA: Prostate Specific Ag, Serum: 0.2 ng/mL (ref 0.0–4.0)

## 2020-01-10 ENCOUNTER — Ambulatory Visit (INDEPENDENT_AMBULATORY_CARE_PROVIDER_SITE_OTHER): Payer: BC Managed Care – PPO | Admitting: Family Medicine

## 2020-01-10 ENCOUNTER — Other Ambulatory Visit: Payer: Self-pay

## 2020-01-10 ENCOUNTER — Encounter: Payer: Self-pay | Admitting: Family Medicine

## 2020-01-10 VITALS — BP 128/82 | Temp 98.2°F | Ht 69.0 in | Wt 210.4 lb

## 2020-01-10 DIAGNOSIS — Z0000 Encounter for general adult medical examination without abnormal findings: Secondary | ICD-10-CM

## 2020-01-10 MED ORDER — SHINGRIX 50 MCG/0.5ML IM SUSR: 0.5000 mL | Freq: Once | INTRAMUSCULAR | 1 refills | Status: AC

## 2020-01-10 NOTE — Progress Notes (Signed)
Subjective:    Patient ID: Ethan Walls, male    DOB: Sep 19, 1961, 59 y.o.   MRN: [id number]  HPI The patient comes in today for a wellness visit.    A review of their health history was completed.  A review of medications was also completed.  Any needed refills; none at this time  Eating habits: healthy   Falls/  MVA accidents in past few months: none  Regular exercise: work 42 hrs week; sit ups; toe touches and fishing   Specialist pt sees on regular basis: none  Preventative health issues were discussed.   Additional concerns: right hip pain  Blood pressure medicine and blood pressure levels reviewed today with patient. Compliant with blood pressure medicine. States does not miss a dose. No obvious side effects. Blood pressure generally good when checked elsewhere. Watching salt intake.  Hip painful or the past six months   [employer]  Works there   Results for orders placed or performed in visit on 12/15/19  Lipid panel  Result Value Ref Range   Cholesterol, Total 178 100 - 199 mg/dL   Triglycerides 84 0 - 149 mg/dL   HDL 44 >39 mg/dL   VLDL Cholesterol Cal 16 5 - 40 mg/dL   LDL Chol Calc (NIH) 118 (H) 0 - 99 mg/dL   Chol/HDL Ratio 4.0 0.0 - 5.0 ratio  Hepatic function panel  Result Value Ref Range   Total Protein 6.8 6.0 - 8.5 g/dL   Albumin 4.5 3.8 - 4.8 g/dL   Bilirubin Total 0.6 0.0 - 1.2 mg/dL   Bilirubin, Direct 0.17 0.00 - 0.40 mg/dL   Alkaline Phosphatase 65 39 - 117 IU/L   AST 32 0 - 40 IU/L   ALT 39 0 - 44 IU/L  Basic metabolic panel  Result Value Ref Range   Glucose 100 (H) 65 - 99 mg/dL   BUN 15 8 - 27 mg/dL   Creatinine, Ser 1.17 0.76 - 1.27 mg/dL   GFR calc non Af Amer 67 >59 mL/min/1.73   GFR calc Af Amer 77 >59 mL/min/1.73   BUN/Creatinine Ratio 13 10 - 24   Sodium 139 134 - 144 mmol/L   Potassium 4.2 3.5 - 5.2 mmol/L   Chloride 104 96 - 106 mmol/L   CO2 21 20 - 29 mmol/L   Calcium 9.2 8.6 - 10.2 mg/dL  PSA  Result Value Ref Range    Prostate Specific Ag, Serum 0.2 0.0 - 4.0 ng/mL   Blood pressure medicine and blood pressure levels reviewed today with patient. Compliant with blood pressure medicine. States does not miss a dose. No obvious side effects. Blood pressure generally good when checked elsewhere. Watching salt intake.   Review of Systems No headache, no major weight loss or weight gain, no chest pain no back pain abdominal pain no change in bowel habits complete ROS otherwise negative     Objective:   Physical Exam Vitals reviewed.  Constitutional:      Appearance: He is well-developed.  HENT:     Head: Normocephalic and atraumatic.     Right Ear: External ear normal.     Left Ear: External ear normal.     Nose: Nose normal.  Eyes:     Pupils: Pupils are equal, round, and reactive to light.  Neck:     Thyroid: No thyromegaly.  Cardiovascular:     Rate and Rhythm: Normal rate and regular rhythm.     Heart sounds: Normal heart sounds. No murmur.  Pulmonary:     Effort: Pulmonary effort is normal. No respiratory distress.     Breath sounds: Normal breath sounds. No wheezing.  Abdominal:     General: Bowel sounds are normal. There is no distension.     Palpations: Abdomen is soft. There is no mass.     Tenderness: There is no abdominal tenderness.  Genitourinary:    Penis: Normal.   Musculoskeletal:        General: Normal range of motion.     Cervical back: Normal range of motion and neck supple.  Lymphadenopathy:     Cervical: No cervical adenopathy.  Skin:    General: Skin is warm and dry.     Findings: No erythema.  Neurological:     Mental Status: He is alert.     Motor: No abnormal muscle tone.  Psychiatric:        Behavior: Behavior normal.        Judgment: Judgment normal.    Prostate exam within normal limits  Right hip good range of motion.  No trochanteric tenderness       Assessment & Plan:  Impression 1 wellness exam.  Next colonoscopy due in 1 year.  Diet discussed.   Exercise discussed.  Vaccines discussed has already had the Covid shot.  2.  Hypertension good control discussed maintain same meds  3.  Right hip pain nonspecific.  Doubt substantial arthritis.  Description is similar to trochanteric bursitis but no obvious tenderness to palpation of trochanter.  If persists would recommend Ortho evaluation  Medications refilled diet exercise discussed follow-up in 6 months Shingrix vaccine given prescription

## 2020-02-12 ENCOUNTER — Other Ambulatory Visit: Payer: Self-pay | Admitting: Family Medicine

## 2020-07-31 ENCOUNTER — Other Ambulatory Visit: Payer: Self-pay | Admitting: *Deleted

## 2020-08-01 NOTE — Telephone Encounter (Signed)
Left message

## 2020-08-07 MED ORDER — ENALAPRIL MALEATE 20 MG PO TABS: 20.0000 mg | ORAL_TABLET | Freq: Every day | ORAL | 0 refills | Status: DC

## 2020-08-07 NOTE — Telephone Encounter (Signed)
Made appt for 11/30 at 8:40 for Med check

## 2020-08-27 ENCOUNTER — Encounter: Payer: Self-pay | Admitting: Family Medicine

## 2020-08-27 ENCOUNTER — Ambulatory Visit: Payer: BC Managed Care – PPO | Admitting: Family Medicine

## 2020-08-27 VITALS — BP 150/90 | HR 75 | Temp 98.8°F | Wt 214.2 lb

## 2020-08-27 DIAGNOSIS — E785 Hyperlipidemia, unspecified: Secondary | ICD-10-CM | POA: Diagnosis not present

## 2020-08-27 DIAGNOSIS — H9202 Otalgia, left ear: Secondary | ICD-10-CM | POA: Diagnosis not present

## 2020-08-27 DIAGNOSIS — Z125 Encounter for screening for malignant neoplasm of prostate: Secondary | ICD-10-CM | POA: Diagnosis not present

## 2020-08-27 DIAGNOSIS — I10 Essential (primary) hypertension: Secondary | ICD-10-CM | POA: Diagnosis not present

## 2020-08-27 MED ORDER — ENALAPRIL MALEATE 20 MG PO TABS: ORAL_TABLET | ORAL | 2 refills | Status: DC

## 2020-08-27 NOTE — Progress Notes (Signed)
Patient ID: Ethan Walls, male    DOB: 1960/11/16, 59 y.o.   MRN: [id number]   Chief Complaint  Patient presents with  . Hypertension  . Ear Pain    Patient reports left ear pain on and off for about a year.    Subjective:    HPI  F/u htn and ear pain.  Pt having intermittent pain on left ear.  On/off for 1 yr. No dec in hearing. Rubbing on back of ear, and feeling his glasses and mask may be hurting the back of his ear. Feeling pain in the ear after flying to [facility] the last few times. It hurts for one day, feels a sharp pain in the ear.  Only every few months when flying to [place].  No fever,  No coughing or uri symptoms in the last few wks. Coughing and clearing in morning- chronic during season changes.  htn- no side effects.  Compliant with meds. Taking it at night enalapril.   Medical History Ethan Walls has a past medical history of Hiccups and Hypertension.   Outpatient Encounter Medications as of 08/27/2020  Medication Sig  . Cholecalciferol (VITAMIN D) 50 MCG (2000 UT) CAPS Take by mouth.  Marland Kitchen aspirin EC 81 MG tablet Take 81 mg by mouth daily.  . enalapril (VASOTEC) 20 MG tablet Take 1/2 tab p.o.  in am and then take 1 tab in pm.  . [DISCONTINUED] enalapril (VASOTEC) 20 MG tablet Take 1 tablet (20 mg total) by mouth daily.   No facility-administered encounter medications on file as of 08/27/2020.     Review of Systems  Constitutional: Negative for chills and fever.  HENT: Positive for ear pain (intermittent). Negative for congestion, rhinorrhea and sore throat.   Respiratory: Negative for cough, shortness of breath and wheezing.   Cardiovascular: Negative for chest pain and leg swelling.  Gastrointestinal: Negative for abdominal pain, diarrhea, nausea and vomiting.  Genitourinary: Negative for dysuria and frequency.  Skin: Negative for rash.  Neurological: Negative for dizziness, weakness and headaches.     Vitals BP (!) 150/90   Pulse 75   Temp  98.8 F (37.1 C)   Wt 214 lb 3.2 oz (97.2 kg)   SpO2 99%   BMI 31.63 kg/m   Objective:   Physical Exam Vitals and nursing note reviewed.  Constitutional:      General: He is not in acute distress.    Appearance: Normal appearance. He is not ill-appearing.  HENT:     Head: Normocephalic.     Right Ear: Tympanic membrane, ear canal and external ear normal.     Left Ear: Tympanic membrane, ear canal and external ear normal.     Nose: Nose normal. No congestion.     Mouth/Throat:     Mouth: Mucous membranes are moist.     Pharynx: No oropharyngeal exudate.  Eyes:     Extraocular Movements: Extraocular movements intact.     Conjunctiva/sclera: Conjunctivae normal.     Pupils: Pupils are equal, round, and reactive to light.  Cardiovascular:     Rate and Rhythm: Normal rate and regular rhythm.     Pulses: Normal pulses.     Heart sounds: Normal heart sounds. No murmur heard.   Pulmonary:     Effort: Pulmonary effort is normal.     Breath sounds: Normal breath sounds. No wheezing, rhonchi or rales.  Musculoskeletal:        General: Normal range of motion.     Right lower leg:  No edema.     Left lower leg: No edema.  Skin:    General: Skin is warm and dry.     Findings: No rash.  Neurological:     General: No focal deficit present.     Mental Status: He is alert and oriented to person, place, and time.     Cranial Nerves: No cranial nerve deficit.  Psychiatric:        Mood and Affect: Mood normal.        Behavior: Behavior normal.        Thought Content: Thought content normal.        Judgment: Judgment normal.      Assessment and Plan   1. Essential hypertension, benign - CBC - CMP14+EGFR  2. Hyperlipidemia LDL goal <130 - Lipid panel  3. Screening PSA (prostate specific antigen) - PSA; Future  4. Ear pain, left   htn- Reck bp.- pt bp still elevated on recheck.  Pt to take 1/2 tab $Remov'20mg'BcrWen$  enalapril in am and 1 tab in pm, $Remo'20mg'vLREy$  enalapril.   Left ear  pain-intermittent.  Noticing it every 3 months when flying to [place]. Not wanting to see ENT at this time.  Recommending allergy meds and flonase for days prior to flying to see if will improve the pain.  Pt in agreement.  hld- stable. Cont diet modification.  Not on meds.  Labs ordered. Will get in the next 1-2 wks.  F/u 42mo or prn.

## 2020-08-27 NOTE — Patient Instructions (Signed)
Take 1/2 tablet of enalapril and 1 tab in evening. Call or return to office if not feeling well.

## 2020-11-21 ENCOUNTER — Encounter: Payer: Self-pay | Admitting: Internal Medicine

## 2020-11-21 ENCOUNTER — Telehealth: Payer: Self-pay

## 2020-11-21 NOTE — Telephone Encounter (Signed)
Pt needs blood work done has appt 03/15  Pt call back [phone]

## 2020-11-22 ENCOUNTER — Other Ambulatory Visit: Payer: Self-pay

## 2020-11-22 DIAGNOSIS — Z125 Encounter for screening for malignant neoplasm of prostate: Secondary | ICD-10-CM | POA: Diagnosis not present

## 2020-11-22 DIAGNOSIS — I10 Essential (primary) hypertension: Secondary | ICD-10-CM | POA: Diagnosis not present

## 2020-11-22 DIAGNOSIS — E785 Hyperlipidemia, unspecified: Secondary | ICD-10-CM | POA: Diagnosis not present

## 2020-11-22 NOTE — Telephone Encounter (Signed)
Lab order placed in November still active. Contacted patient to let him know those lab orders from November are still good. Pt verbalized understanding and will go have them done today.

## 2020-11-23 LAB — CMP14+EGFR
ALT: 40 IU/L (ref 0–44)
AST: 27 IU/L (ref 0–40)
Albumin/Globulin Ratio: 2.1 (ref 1.2–2.2)
Albumin: 4.6 g/dL (ref 3.8–4.8)
Alkaline Phosphatase: 72 IU/L (ref 44–121)
BUN/Creatinine Ratio: 17 (ref 10–24)
BUN: 20 mg/dL (ref 8–27)
Bilirubin Total: 0.6 mg/dL (ref 0.0–1.2)
CO2: 24 mmol/L (ref 20–29)
Calcium: 9.6 mg/dL (ref 8.6–10.2)
Chloride: 102 mmol/L (ref 96–106)
Creatinine, Ser: 1.15 mg/dL (ref 0.76–1.27)
GFR calc Af Amer: 78 mL/min/{1.73_m2} (ref >59)
GFR calc non Af Amer: 68 mL/min/{1.73_m2} (ref >59)
Globulin, Total: 2.2 g/dL (ref 1.5–4.5)
Glucose: 94 mg/dL (ref 65–99)
Potassium: 4.8 mmol/L (ref 3.5–5.2)
Sodium: 140 mmol/L (ref 134–144)
Total Protein: 6.8 g/dL (ref 6.0–8.5)

## 2020-11-23 LAB — LIPID PANEL
Chol/HDL Ratio: 4 ratio (ref 0.0–5.0)
Cholesterol, Total: 197 mg/dL (ref 100–199)
HDL: 49 mg/dL (ref >39)
LDL Chol Calc (NIH): 133 mg/dL — ABNORMAL HIGH (ref 0–99)
Triglycerides: 84 mg/dL (ref 0–149)
VLDL Cholesterol Cal: 15 mg/dL (ref 5–40)

## 2020-11-23 LAB — CBC
Hematocrit: 47.7 % (ref 37.5–51.0)
Hemoglobin: 16 g/dL (ref 13.0–17.7)
MCH: 28.6 pg (ref 26.6–33.0)
MCHC: 33.5 g/dL (ref 31.5–35.7)
MCV: 85 fL (ref 79–97)
Platelets: 259 10*3/uL (ref 150–450)
RBC: 5.59 x10E6/uL (ref 4.14–5.80)
RDW: 12.9 % (ref 11.6–15.4)
WBC: 7.1 10*3/uL (ref 3.4–10.8)

## 2020-11-23 LAB — PSA: Prostate Specific Ag, Serum: 0.2 ng/mL (ref 0.0–4.0)

## 2020-11-26 ENCOUNTER — Ambulatory Visit: Payer: BC Managed Care – PPO | Admitting: Family Medicine

## 2020-12-11 ENCOUNTER — Encounter: Payer: Self-pay | Admitting: Family Medicine

## 2020-12-11 ENCOUNTER — Ambulatory Visit: Payer: BC Managed Care – PPO | Admitting: Family Medicine

## 2020-12-17 ENCOUNTER — Telehealth: Payer: Self-pay | Admitting: Family Medicine

## 2020-12-17 MED ORDER — ENALAPRIL MALEATE 20 MG PO TABS
ORAL_TABLET | ORAL | 0 refills | Status: DC
Start: 2020-12-17 — End: 2020-12-30

## 2020-12-17 NOTE — Telephone Encounter (Signed)
Please advise. Thank you

## 2020-12-17 NOTE — Telephone Encounter (Signed)
Pt contacted and verbalized understanding.  

## 2020-12-17 NOTE — Telephone Encounter (Signed)
No results found for: HGBA1C  Lab Results  Component Value Date   CREATININE 1.15 11/22/2020     Lab Results  Component Value Date   CHOL 197 11/22/2020   HDL 49 11/22/2020   LDLCALC 133 (H) 11/22/2020   TRIG 84 11/22/2020   CHOLHDL 4.0 11/22/2020     BP Readings from Last 3 Encounters:  08/27/20 (!) 150/90  01/10/20 128/82  06/01/19 (!) 151/91

## 2020-12-17 NOTE — Telephone Encounter (Signed)
Patient missed appointment on 3/16 but only has 3 pills left of his enalapril 20 mg needing refills has appointment on 4/5. Ethan Walls

## 2020-12-30 ENCOUNTER — Other Ambulatory Visit: Payer: Self-pay

## 2020-12-30 ENCOUNTER — Ambulatory Visit: Payer: BC Managed Care – PPO | Admitting: Family Medicine

## 2020-12-30 VITALS — BP 150/92 | HR 92 | Temp 99.1°F | Ht 69.0 in | Wt 211.0 lb

## 2020-12-30 DIAGNOSIS — I10 Essential (primary) hypertension: Secondary | ICD-10-CM | POA: Diagnosis not present

## 2020-12-30 MED ORDER — ENALAPRIL MALEATE 20 MG PO TABS: 20.0000 mg | ORAL_TABLET | Freq: Two times a day (BID) | ORAL | 1 refills | Status: DC

## 2020-12-30 NOTE — Progress Notes (Signed)
Patient ID: Ethan Walls, male    DOB: 05-07-1961, 60 y.o.   MRN: [id number]   Chief Complaint  Patient presents with  . Hypertension    Follow up - Medication refills    Subjective:    HPI  F/u htn- Taking 1/2 tab in am and 1 tab at night for enalapril 20mg . Pt stating had his meds today.  HTN Pt compliant with BP meds.  No SEs Denies chest pain, sob, LE swelling, or blurry vision.    Medical History Ethan Walls has a past medical history of Hiccups and Hypertension.   Outpatient Encounter Medications as of 12/30/2020  Medication Sig  . aspirin EC 81 MG tablet Take 81 mg by mouth daily.  . Cholecalciferol (VITAMIN D) 50 MCG (2000 UT) CAPS Take by mouth.  . [DISCONTINUED] enalapril (VASOTEC) 20 MG tablet Take 1/2 tab p.o.  in am and then take 1 tab in pm.  . enalapril (VASOTEC) 20 MG tablet Take 1 tablet (20 mg total) by mouth 2 (two) times daily.   No facility-administered encounter medications on file as of 12/30/2020.     Review of Systems  Constitutional: Negative for chills and fever.  HENT: Negative for congestion, rhinorrhea and sore throat.   Respiratory: Negative for cough, shortness of breath and wheezing.   Cardiovascular: Negative for chest pain and leg swelling.  Gastrointestinal: Negative for abdominal pain, diarrhea, nausea and vomiting.  Genitourinary: Negative for dysuria and frequency.  Skin: Negative for rash.  Neurological: Negative for dizziness, weakness and headaches.     Vitals BP (!) 150/92   Pulse 92   Temp 99.1 F (37.3 C)   Ht 5\' 9"  (1.753 m)   Wt 211 lb (95.7 kg)   SpO2 97%   BMI 31.16 kg/m   Objective:   Physical Exam Vitals and nursing note reviewed.  Constitutional:      General: He is not in acute distress.    Appearance: Normal appearance. He is not ill-appearing.  HENT:     Head: Normocephalic.     Nose: Nose normal. No congestion.     Mouth/Throat:     Mouth: Mucous membranes are moist.     Pharynx: No  oropharyngeal exudate.  Eyes:     Extraocular Movements: Extraocular movements intact.     Conjunctiva/sclera: Conjunctivae normal.     Pupils: Pupils are equal, round, and reactive to light.  Cardiovascular:     Rate and Rhythm: Normal rate and regular rhythm.     Pulses: Normal pulses.     Heart sounds: Normal heart sounds. No murmur heard.   Pulmonary:     Effort: Pulmonary effort is normal.     Breath sounds: Normal breath sounds. No wheezing, rhonchi or rales.  Musculoskeletal:        General: Normal range of motion.     Right lower leg: No edema.     Left lower leg: No edema.  Skin:    General: Skin is warm and dry.     Findings: No rash.  Neurological:     General: No focal deficit present.     Mental Status: He is alert and oriented to person, place, and time.     Cranial Nerves: No cranial nerve deficit.  Psychiatric:        Mood and Affect: Mood normal.        Behavior: Behavior normal.        Thought Content: Thought content normal.  Judgment: Judgment normal.      Assessment and Plan   1. Essential hypertension, benign - enalapril (VASOTEC) 20 MG tablet; Take 1 tablet (20 mg total) by mouth 2 (two) times daily.  Dispense: 180 tablet; Refill: 1   htn- uncontrolled.  Increase enalapril from 1/2 tab in am and 1 in pm to 1 tab bid.   - pt is due 2022- for repeat colonoscopy. Pt to call GI, pt stating has number.   Return in about 6 months (around 07/01/2021) for f/u htn.

## 2020-12-30 NOTE — Patient Instructions (Addendum)
Take new dose of enalapril 1 tab in am and 1 in pm.  Call if seeing numbers over 150/90 or under 100/70.  Call Dr. Sydell Axon- office for repeat colonoscopy.   For ear pain- fluticosone or flonase nose spray and take claritin or allegra tablet.  Start 3-5 days before flying.

## 2021-01-01 ENCOUNTER — Ambulatory Visit: Payer: BC Managed Care – PPO | Admitting: Family Medicine

## 2021-06-15 ENCOUNTER — Other Ambulatory Visit: Payer: Self-pay

## 2021-06-15 ENCOUNTER — Ambulatory Visit
Admission: EM | Admit: 2021-06-15 | Discharge: 2021-06-15 | Disposition: A | Discharge status: Home / Self Care | Payer: BC Managed Care – PPO | Attending: Family Medicine | Admitting: Family Medicine

## 2021-06-15 ENCOUNTER — Encounter: Payer: Self-pay | Admitting: Emergency Medicine

## 2021-06-15 DIAGNOSIS — N3091 Cystitis, unspecified with hematuria: Secondary | ICD-10-CM | POA: Diagnosis not present

## 2021-06-15 DIAGNOSIS — N3090 Cystitis, unspecified without hematuria: Secondary | ICD-10-CM | POA: Diagnosis present

## 2021-06-15 DIAGNOSIS — R319 Hematuria, unspecified: Secondary | ICD-10-CM | POA: Diagnosis present

## 2021-06-15 LAB — POCT URINALYSIS DIP (MANUAL ENTRY)
Bilirubin, UA: NEGATIVE
Glucose, UA: 100 mg/dL — AB
Ketones, POC UA: NEGATIVE mg/dL
Leukocytes, UA: NEGATIVE
Nitrite, UA: NEGATIVE
Protein Ur, POC: NEGATIVE mg/dL
Spec Grav, UA: 1.025 (ref 1.010–1.025)
Urobilinogen, UA: 0.2 E.U./dL
pH, UA: 5.5 (ref 5.0–8.0)

## 2021-06-15 MED ORDER — SULFAMETHOXAZOLE-TRIMETHOPRIM 800-160 MG PO TABS: 1.0000 | ORAL_TABLET | Freq: Two times a day (BID) | ORAL | 0 refills | Status: DC

## 2021-06-15 NOTE — ED Triage Notes (Signed)
Noticed blood in urine and burning on urination since last night.

## 2021-06-15 NOTE — ED Provider Notes (Signed)
[facility]    CSN: [id number] Arrival date & time: 06/15/21  1039      History   Chief Complaint No chief complaint on file.   HPI Ethan Walls is a 60 y.o. male.   HPI Patient developed acute onset urinary urgency and frequency yesterday.  He reports last evening seeing a gross amount of blood with urination and was awakened multiple times during the night to urinate.  He reports seeing scant amount of blood following his shower.  Today he is also seeing blood with urination.  Denies any flank pain, abdominal pain, pelvic pain, nausea or vomiting or fever.  Patient has no prior history of renal stones and he is not a smoker.  No history of diabetes or chronic UTIs. Past Medical History:  Diagnosis Date   Hiccups    Hypertension     Patient Active Problem List   Diagnosis Date Noted   Prostate hypertrophy 11/04/2016   History of colonic polyps    Diverticulosis of colon without hemorrhage    Essential hypertension, benign 02/18/2014   Hyperlipidemia LDL goal <130 02/18/2014    Past Surgical History:  Procedure Laterality Date   COLONOSCOPY N/A 12/19/2015   Procedure: COLONOSCOPY;  Surgeon: Daneil Dolin, MD;  Location: [facility];  Service: Endoscopy;  Laterality: N/A;  2:00 PM -  moved to 2:15 - office to notify   Left index finger         Home Medications    Prior to Admission medications   Medication Sig Start Date End Date Taking? Authorizing Provider  sulfamethoxazole-trimethoprim (BACTRIM DS) 800-160 MG tablet Take 1 tablet by mouth 2 (two) times daily. 06/15/21  Yes Scot Jun, FNP  aspirin EC 81 MG tablet Take 81 mg by mouth daily.    [provider]  Cholecalciferol (VITAMIN D) 50 MCG (2000 UT) CAPS Take by mouth.    [provider]  enalapril (VASOTEC) 20 MG tablet Take 1 tablet (20 mg total) by mouth 2 (two) times daily. 12/30/20   Erven Colla, DO    Family History History reviewed. No pertinent family  history.  Social History Social History   Tobacco Use   Smoking status: Never   Smokeless tobacco: Never  Substance Use Topics   Alcohol use: No   Drug use: No     Allergies   Patient has no known allergies.   Review of Systems Review of Systems Pertinent negatives listed in HPI   Physical Exam Triage Vital Signs ED Triage Vitals  Enc Vitals Group     BP 06/15/21 1053 (!) 161/93     Pulse Rate 06/15/21 1053 70     Resp 06/15/21 1053 16     Temp 06/15/21 1053 98.4 F (36.9 C)     Temp src --      SpO2 06/15/21 1053 96 %     Weight --      Height --      Head Circumference --      Peak Flow --      Pain Score 06/15/21 1055 2     Pain Loc --      Pain Edu? --      Excl. in [place]? --    No data found.  Updated Vital Signs BP (!) 161/93 (BP Location: Right Arm)   Pulse 70   Temp 98.4 F (36.9 C)   Resp 16   SpO2 96%   Visual Acuity Right Eye  Distance:   Left Eye Distance:   Bilateral Distance:    Right Eye Near:   Left Eye Near:    Bilateral Near:     Physical Exam Constitutional: Patient appears well-developed and well-nourished. No distress. HENT: Normocephalic, atraumatic, External right and left ear normal.  Eyes: Conjunctivae and EOM are normal. PERRLA, no scleral icterus. Neck: Normal ROM. Neck supple. No JVD. No tracheal deviation. No thyromegaly. CVS: RRR, S1/S2 +, no murmurs, no gallops, no carotid bruit.  Pulmonary: Effort and breath sounds normal, no stridor, rhonchi, wheezes, rales.  Abdominal: Soft. BS +, no distension, tenderness, rebound or guarding.  Musculoskeletal: Normal range of motion. No edema and no tenderness.  Neuro: Alert. Normal reflexes, muscle tone coordination. No cranial nerve deficit. Skin: Skin is warm and dry. No rash noted. Not diaphoretic. No erythema. No pallor.  UC Treatments / Results  Labs (all labs ordered are listed, but only abnormal results are displayed) Labs Reviewed  POCT URINALYSIS DIP (MANUAL ENTRY)  - Abnormal; Notable for the following components:      Result Value   Glucose, UA =100 (*)    Blood, UA large (*)    All other components within normal limits  URINE CULTURE    EKG   Radiology No results found.  Procedures Procedures (including critical care time)  Medications Ordered in UC Medications - No data to display  Initial Impression / Assessment and Plan / UC Course  I have reviewed the triage vital signs and the nursing notes.  Pertinent labs & imaging results that were available during my care of the patient were reviewed by me and considered in my medical decision making (see chart for details).     Patient presents today for evaluation of acute onset hematuria with pain with urination.  UA has a large amount of blood with trace of glucose will culture urine however given patient's symptoms and per EMR patient has a distant history of enlarged prostate will cover with an antibiotic.  Referral placed for patient to follow-up emergently with urology given acute onset hematuria.  Patient verbalized understanding agreement plan. Final Clinical Impressions(s) / UC Diagnoses   Final diagnoses:  Hematuria, unspecified type  Cystitis   Discharge Instructions   None    ED Prescriptions     Medication Sig Dispense Auth. Provider   sulfamethoxazole-trimethoprim (BACTRIM DS) 800-160 MG tablet Take 1 tablet by mouth 2 (two) times daily. 20 tablet Scot Jun, FNP      PDMP not reviewed this encounter.   Scot Jun, FNP 06/15/21 [phone]

## 2021-06-16 LAB — URINE CULTURE
Culture: NO GROWTH
Special Requests: NORMAL

## 2021-07-10 ENCOUNTER — Other Ambulatory Visit: Payer: Self-pay | Admitting: Family Medicine

## 2021-07-10 ENCOUNTER — Other Ambulatory Visit: Payer: Self-pay

## 2021-07-10 ENCOUNTER — Ambulatory Visit: Payer: BC Managed Care – PPO | Admitting: Family Medicine

## 2021-07-10 VITALS — BP 149/90 | HR 75 | Ht 69.0 in | Wt 216.0 lb

## 2021-07-10 DIAGNOSIS — I10 Essential (primary) hypertension: Secondary | ICD-10-CM

## 2021-07-10 DIAGNOSIS — N400 Enlarged prostate without lower urinary tract symptoms: Secondary | ICD-10-CM | POA: Insufficient documentation

## 2021-07-10 DIAGNOSIS — E7800 Pure hypercholesterolemia, unspecified: Secondary | ICD-10-CM | POA: Diagnosis not present

## 2021-07-10 DIAGNOSIS — R319 Hematuria, unspecified: Secondary | ICD-10-CM | POA: Diagnosis not present

## 2021-07-10 DIAGNOSIS — Z23 Encounter for immunization: Secondary | ICD-10-CM

## 2021-07-10 MED ORDER — ROSUVASTATIN CALCIUM 10 MG PO TABS
10.0000 mg | ORAL_TABLET | Freq: Every day | ORAL | 3 refills | Status: DC
Start: 2021-07-10 — End: 2022-01-21

## 2021-07-10 NOTE — Assessment & Plan Note (Signed)
Uncontrolled.  10-year ASCVD risk score is 15.2%.  I recommended starting medication and patient was in agreement.  Starting Crestor. Additionally, patient has been on aspirin daily.  I advised him that based off of recent trials he may stop.

## 2021-07-10 NOTE — Assessment & Plan Note (Signed)
Recent abnormal UA (large blood); negative culture. Has an appt with urology schedule. Rechecking urine today. Currently asymptomatic.

## 2021-07-10 NOTE — Progress Notes (Signed)
Subjective:  Patient ID: Ethan Walls, male    DOB: 11/28/1960  Age: 60 y.o. MRN: [id number]  CC: Chief Complaint  Patient presents with   Hypertension    Follow up- establish care No problems or concerns     HPI:  60 year old male presents for follow-up and to establish care with me.  Hypertension Patient is currently on enalapril 20 mg twice daily.  Blood pressure is uncontrolled.  He does not check his blood pressures at home.  He states that he is feeling well.  Denies chest pain or shortness of breath.  Hyperlipidemia Uncontrolled.  Most recent LDL 133. Patient states that he was on cholesterol medication in the past.  Unsure of what he took then. Patient is currently on daily aspirin.  Will discuss cessation today given fairly recent aspirin trials.  Abnormal urinalysis/hematuria Patient seen at urgent care on 9/18.  Large blood was noted on urinalysis. Culture was negative. Urology appointment was arranged.  It is upcoming in November. Patient denies any urinary symptoms at this time.  He states that his urine is clear. Urine has not been rechecked since urgent care visit.  Patient Active Problem List   Diagnosis Date Noted   BPH (benign prostatic hyperplasia) 07/10/2021   Hematuria 07/10/2021   History of colonic polyps    Diverticulosis of colon without hemorrhage    Essential hypertension, benign 02/18/2014   Hyperlipidemia 02/18/2014    Social Hx   Social History   Socioeconomic History   Marital status: Married    Spouse name: Not on file   Number of children: Not on file   Years of education: Not on file   Highest education level: Not on file  Occupational History   Not on file  Tobacco Use   Smoking status: Never   Smokeless tobacco: Never  Substance and Sexual Activity   Alcohol use: No   Drug use: No   Sexual activity: Not on file  Other Topics Concern   Not on file  Social History Narrative   Not on file   Social Determinants of  Health   Financial Resource Strain: Not on file  Food Insecurity: Not on file  Transportation Needs: Not on file  Physical Activity: Not on file  Stress: Not on file  Social Connections: Not on file    Review of Systems  Constitutional: Negative.   Respiratory: Negative.    Cardiovascular: Negative.   Genitourinary: Negative.     Objective:  BP (!) 149/90   Pulse 75   Ht 5\' 9"  (1.753 m)   Wt 216 lb (98 kg)   BMI 31.90 kg/m   BP/Weight 07/10/2021 7/37/1062 03/06/4853  Systolic BP 627 035 009  Diastolic BP 90 93 92  Wt. (Lbs) 216 - 211  BMI 31.9 - 31.16    Physical Exam Vitals and nursing note reviewed.  Constitutional:      General: He is not in acute distress.    Appearance: Normal appearance. He is not ill-appearing.  HENT:     Head: Normocephalic and atraumatic.  Eyes:     General:        Right eye: No discharge.        Left eye: No discharge.     Conjunctiva/sclera: Conjunctivae normal.  Cardiovascular:     Rate and Rhythm: Normal rate and regular rhythm.  Pulmonary:     Effort: Pulmonary effort is normal.     Breath sounds: Normal breath sounds. No wheezing, rhonchi or  rales.  Neurological:     Mental Status: He is alert.  Psychiatric:        Mood and Affect: Mood normal.        Behavior: Behavior normal.    Lab Results  Component Value Date   WBC 7.1 11/22/2020   HGB 16.0 11/22/2020   HCT 47.7 11/22/2020   PLT 259 11/22/2020   GLUCOSE 94 11/22/2020   CHOL 197 11/22/2020   TRIG 84 11/22/2020   HDL 49 11/22/2020   LDLCALC 133 (H) 11/22/2020   ALT 40 11/22/2020   AST 27 11/22/2020   NA 140 11/22/2020   K 4.8 11/22/2020   CL 102 11/22/2020   CREATININE 1.15 11/22/2020   BUN 20 11/22/2020   CO2 24 11/22/2020   PSA 0.21 02/15/2014     Assessment & Plan:   Problem List Items Addressed This Visit       Cardiovascular and Mediastinum   Essential hypertension, benign    Uncontrolled; not at goal. Will check BP's at home. If persistently  elevated will add Amlodipine. Patient will bring in BP readings or send via mychart.      Relevant Medications   rosuvastatin (CRESTOR) 10 MG tablet     Other   Hematuria - Primary    Recent abnormal UA (large blood); negative culture. Has an appt with urology schedule. Rechecking urine today. Currently asymptomatic.       Relevant Orders   Urinalysis, Routine w reflex microscopic   Hyperlipidemia    Uncontrolled.  10-year ASCVD risk score is 15.2%.  I recommended starting medication and patient was in agreement.  Starting Crestor. Additionally, patient has been on aspirin daily.  I advised him that based off of recent trials he may stop.      Relevant Medications   rosuvastatin (CRESTOR) 10 MG tablet   Other Visit Diagnoses     Need for vaccination       Relevant Orders   Flu Vaccine QUAD 51mo+IM (Fluarix, Fluzone & Alfiuria Quad PF) (Completed)       Meds ordered this encounter  Medications   rosuvastatin (CRESTOR) 10 MG tablet    Sig: Take 1 tablet (10 mg total) by mouth daily.    Dispense:  90 tablet    Refill:  3    Follow-up:  Return in about 3 months (around 10/10/2021) for HTN follow up.  [place]

## 2021-07-10 NOTE — Assessment & Plan Note (Signed)
Uncontrolled; not at goal. Will check BP's at home. If persistently elevated will add Amlodipine. Patient will bring in BP readings or send via mychart.

## 2021-07-10 NOTE — Patient Instructions (Signed)
Check BP at home and let me know the readings.  Start the cholesterol medication.  Stop by the lab to recheck urine.  Stop the aspirin.   Follow up in 3 months.  Take care  Dr. Lacinda Axon

## 2021-07-11 ENCOUNTER — Telehealth: Payer: Self-pay | Admitting: Family Medicine

## 2021-07-11 LAB — URINALYSIS, ROUTINE W REFLEX MICROSCOPIC
Bilirubin, UA: NEGATIVE
Glucose, UA: NEGATIVE
Ketones, UA: NEGATIVE
Leukocytes,UA: NEGATIVE
Nitrite, UA: NEGATIVE
Protein,UA: NEGATIVE
RBC, UA: NEGATIVE
Specific Gravity, UA: 1.027 (ref 1.005–1.030)
Urobilinogen, Ur: 0.2 mg/dL (ref 0.2–1.0)
pH, UA: 5.5 (ref 5.0–7.5)

## 2021-07-11 NOTE — Telephone Encounter (Signed)
Patient is wanting the result of urine test from 07/10/21

## 2021-07-11 NOTE — Telephone Encounter (Signed)
Patient has been advised per drs notes and recommendations to keep appt w/ urology , patient verbalizes understanding.

## 2021-07-11 NOTE — Telephone Encounter (Signed)
He was seen yesterday by Dr. Lacinda Axon Urine at that time did not show any blood Dr. Jonathon Jordan note states that the patient will be going to urology I would highly recommend that he keep that follow-up visit

## 2021-07-11 NOTE — Telephone Encounter (Signed)
Please advise. Thank you

## 2021-07-22 ENCOUNTER — Telehealth: Payer: Self-pay | Admitting: *Deleted

## 2021-07-22 MED ORDER — AMLODIPINE BESYLATE 10 MG PO TABS: 10.0000 mg | ORAL_TABLET | Freq: Every day | ORAL | 0 refills | Status: DC

## 2021-07-22 NOTE — Telephone Encounter (Signed)
Per Dr Lacinda Axon:  Patient brought BP readings to me. BP persistently elevated. Will you send in Amlodipine 10 mg daily and call him and let him know please.

## 2021-07-22 NOTE — Telephone Encounter (Signed)
Prescription sent electronically to pharmacy.  Patient notified and verbalized understanding. 

## 2021-07-28 NOTE — Progress Notes (Signed)
H&P  Chief Complaint: Blood in urine  History of Present Illness: 60 year old male presents at this time for evaluation and management of gross hematuria.  Referred by Dr. Lacinda Axon.  Pt states that ~ 2 months ago while urinating he pinched his penis to stop his flow. He had immediate dripping out of his penis as well as dysuria which lasted 2-3 voids.  Initial urinalysis revealed microscopic hematuria.  However, on the 13th of last month, 1 month after his episode of hematuria, his urinalysis was clear.  Past Medical History:  Diagnosis Date   Hiccups    Hypertension     Past Surgical History:  Procedure Laterality Date   COLONOSCOPY N/A 12/19/2015   Procedure: COLONOSCOPY;  Surgeon: Daneil Dolin, MD;  Location: [facility];  Service: Endoscopy;  Laterality: N/A;  2:00 PM -  moved to 2:15 - office to notify   Left index finger      Home Medications:  Allergies as of 07/29/2021   No Known Allergies      Medication List        Accurate as of July 28, 2021  7:46 PM. If you have any questions, ask your nurse or doctor.          amLODipine 10 MG tablet Commonly known as: NORVASC Take 1 tablet (10 mg total) by mouth daily.   enalapril 20 MG tablet Commonly known as: VASOTEC Take 1 tablet by mouth twice daily   rosuvastatin 10 MG tablet Commonly known as: Crestor Take 1 tablet (10 mg total) by mouth daily.   Vitamin D 50 MCG (2000 UT) Caps Take by mouth.        Allergies: No Known Allergies  No family history on file.  Social History:  reports that he has never smoked. He has never used smokeless tobacco. He reports that he does not drink alcohol and does not use drugs.  ROS: A complete review of systems was performed.  All systems are negative except for pertinent findings as noted.  Physical Exam:  Vital signs in last 24 hours: There were no vitals taken for this visit. Constitutional:  Alert and oriented, No acute distress Cardiovascular: Regular  rate  Respiratory: Normal respiratory effort GI: Abdomen is soft, nontender, nondistended, no abdominal masses. No CVAT.  No inguinal hernias Genitourinary: Normal male phallus, testes are descended bilaterally and non-tender and without masses, scrotum is normal in appearance without lesions or masses, perineum is normal on inspection.  Normal anal sphincter tone.  Prostate 30 g, symmetrical.  No nodularity or tenderness.  Neurologic: Grossly intact, no focal deficits Psychiatric: Normal mood and affect  I have reviewed prior pt notes  I have reviewed notes from referring/previous physicians  I have reviewed urinalysis results  I have reviewed prior urine culture   Impression/Assessment:  Hematuria, initial, after squeezing his penis/urethra during urinating.  More than likely, this was just due to urethral/prostatic irritation/inflammation.  Resolved  Plan:  1.  I reassured the patient, told him the likely cause  2.  I will have him come back on an as-needed basis

## 2021-07-29 ENCOUNTER — Encounter: Payer: Self-pay | Admitting: Urology

## 2021-07-29 ENCOUNTER — Ambulatory Visit: Payer: BC Managed Care – PPO | Admitting: Urology

## 2021-07-29 ENCOUNTER — Other Ambulatory Visit: Payer: Self-pay

## 2021-07-29 VITALS — BP 116/76 | HR 73 | Temp 98.4°F | Wt 219.4 lb

## 2021-07-29 DIAGNOSIS — R310 Gross hematuria: Secondary | ICD-10-CM

## 2021-07-29 DIAGNOSIS — N390 Urinary tract infection, site not specified: Secondary | ICD-10-CM | POA: Diagnosis not present

## 2021-07-29 NOTE — Progress Notes (Signed)
Urological Symptom Review  Patient is experiencing the following symptoms: Frequent urination Hard to postpone urination Get up at night to urinate   Review of Systems  Gastrointestinal (upper)  : Negative for upper GI symptoms  Gastrointestinal (lower) : Negative for lower GI symptoms  Constitutional : Negative for symptoms  Skin: Negative for skin symptoms  Eyes: Negative for eye symptoms  Ear/Nose/Throat : Negative for Ear/Nose/Throat symptoms  Hematologic/Lymphatic: Negative for Hematologic/Lymphatic symptoms  Cardiovascular : Negative for cardiovascular symptoms  Respiratory : Negative for respiratory symptoms  Endocrine: Negative for endocrine symptoms  Musculoskeletal: Negative for musculoskeletal symptoms  Neurological: Negative for neurological symptoms  Psychologic: Negative for psychiatric symptoms  

## 2022-01-09 ENCOUNTER — Other Ambulatory Visit: Payer: Self-pay

## 2022-01-09 ENCOUNTER — Other Ambulatory Visit: Payer: Self-pay | Admitting: Family Medicine

## 2022-01-09 DIAGNOSIS — I10 Essential (primary) hypertension: Secondary | ICD-10-CM

## 2022-01-09 MED ORDER — ENALAPRIL MALEATE 20 MG PO TABS: 20.0000 mg | ORAL_TABLET | Freq: Two times a day (BID) | ORAL | 0 refills | Status: DC

## 2022-01-21 ENCOUNTER — Ambulatory Visit: Payer: BC Managed Care – PPO | Admitting: Family Medicine

## 2022-01-21 VITALS — BP 132/86 | HR 80 | Ht 69.0 in | Wt 217.4 lb

## 2022-01-21 DIAGNOSIS — N400 Enlarged prostate without lower urinary tract symptoms: Secondary | ICD-10-CM

## 2022-01-21 DIAGNOSIS — Z130 Encounter for screening for diseases of the blood and blood-forming organs and certain disorders involving the immune mechanism: Secondary | ICD-10-CM | POA: Diagnosis not present

## 2022-01-21 DIAGNOSIS — E7800 Pure hypercholesterolemia, unspecified: Secondary | ICD-10-CM

## 2022-01-21 DIAGNOSIS — I10 Essential (primary) hypertension: Secondary | ICD-10-CM

## 2022-01-21 DIAGNOSIS — Z0000 Encounter for general adult medical examination without abnormal findings: Secondary | ICD-10-CM | POA: Insufficient documentation

## 2022-01-21 MED ORDER — ROSUVASTATIN CALCIUM 10 MG PO TABS: 10.0000 mg | ORAL_TABLET | Freq: Every day | ORAL | 11 refills | Status: DC

## 2022-01-21 MED ORDER — AMLODIPINE BESYLATE 10 MG PO TABS: 10.0000 mg | ORAL_TABLET | Freq: Every day | ORAL | 11 refills | Status: DC

## 2022-01-21 MED ORDER — ENALAPRIL MALEATE 20 MG PO TABS: 20.0000 mg | ORAL_TABLET | Freq: Two times a day (BID) | ORAL | 11 refills | Status: DC

## 2022-01-21 NOTE — Assessment & Plan Note (Signed)
Labs ordered.  Continue Crestor. ?

## 2022-01-21 NOTE — Assessment & Plan Note (Signed)
Phone number Dr. Roseanne Kaufman office given to the patient so that he can call and schedule his colonoscopy. ?

## 2022-01-21 NOTE — Patient Instructions (Signed)
Call [phone] and schedule colonoscopy. ? ?Labs today. ? ?I have refilled your meds. ? ?Follow up in 6 months. ? ?Take care ? ?Dr. Lacinda Axon  ?

## 2022-01-21 NOTE — Assessment & Plan Note (Signed)
Stable.  Continue enalapril and alert pain.  Refilled both enalapril and amlodipine today. ?

## 2022-01-21 NOTE — Progress Notes (Signed)
? ?Subjective:  ?Patient ID: Ethan Walls, male    DOB: [birth year]  Age: 61 y.o. MRN: [id number] ? ?CC: ?Chief Complaint  ?Patient presents with  ? Hypertension  ?  Check up - feels like blood pressure still running high at times  ? ? ?HPI: ? ?61 year old male with hypertension, BPH, hyperlipidemia presents for follow-up. ? ?BP fairly well controlled today.  However, his home readings have been elevated.  He has been out of his amlodipine.  Needs refill.  He is also on enalapril. ? ?Patient's hyperlipidemia is managed with Crestor.  Needs labs. ? ?Patient is overdue for colonoscopy.  We will discuss this today. ? ?Patient states that he is doing well.  He has no other complaints or concerns at this time. ? ?Patient Active Problem List  ? Diagnosis Date Noted  ? Healthcare maintenance 01/21/2022  ? BPH (benign prostatic hyperplasia) 07/10/2021  ? History of colonic polyps   ? Diverticulosis of colon without hemorrhage   ? Essential hypertension, benign 02/18/2014  ? Hyperlipidemia 02/18/2014  ? ? ?Social Hx   ?Social History  ? ?Socioeconomic History  ? Marital status: Married  ?  Spouse name: Not on file  ? Number of children: Not on file  ? Years of education: Not on file  ? Highest education level: Not on file  ?Occupational History  ? Not on file  ?Tobacco Use  ? Smoking status: Never  ? Smokeless tobacco: Never  ?Substance and Sexual Activity  ? Alcohol use: No  ? Drug use: No  ? Sexual activity: Not on file  ?Other Topics Concern  ? Not on file  ?Social History Narrative  ? Not on file  ? ?Social Determinants of Health  ? ?Financial Resource Strain: Not on file  ?Food Insecurity: Not on file  ?Transportation Needs: Not on file  ?Physical Activity: Not on file  ?Stress: Not on file  ?Social Connections: Not on file  ? ? ?Review of Systems  ?Constitutional: Negative.   ?Respiratory: Negative.    ?Cardiovascular: Negative.   ? ? ?Objective:  ?BP 132/86   Pulse 80   Ht _0  (1.753 m)   Wt 217 lb 6.4 oz (98.6  kg)   SpO2 98%   BMI 32.10 kg/m?  ? ? ?  01/21/2022  ?  8:38 AM 07/29/2021  ?  1:05 PM 07/10/2021  ?  2:43 PM  ?BP/Weight  ?Systolic BP 096 283 662  ?Diastolic BP 86 76 90  ?Wt. (Lbs) 217.4 219.4 216  ?BMI 32.1 kg/m2 32.4 kg/m2 31.9 kg/m2  ? ? ?Physical Exam ?Vitals and nursing note reviewed.  ?Constitutional:   ?   General: He is not in acute distress. ?   Appearance: Normal appearance. He is not ill-appearing.  ?HENT:  ?   Head: Normocephalic and atraumatic.  ?Eyes:  ?   General:     ?   Right eye: No discharge.     ?   Left eye: No discharge.  ?   Conjunctiva/sclera: Conjunctivae normal.  ?Cardiovascular:  ?   Rate and Rhythm: Normal rate and regular rhythm.  ?   Heart sounds: No murmur heard. ?Pulmonary:  ?   Effort: Pulmonary effort is normal.  ?   Breath sounds: Normal breath sounds. No wheezing, rhonchi or rales.  ?Neurological:  ?   Mental Status: He is alert.  ?Psychiatric:     ?   Mood and Affect: Mood normal.     ?   Behavior:  Behavior normal.  ? ? ?Lab Results  ?Component Value Date  ? WBC 7.1 11/22/2020  ? HGB 16.0 11/22/2020  ? HCT 47.7 11/22/2020  ? PLT 259 11/22/2020  ? GLUCOSE 94 11/22/2020  ? CHOL 197 11/22/2020  ? TRIG 84 11/22/2020  ? HDL 49 11/22/2020  ? LDLCALC 133 (H) 11/22/2020  ? ALT 40 11/22/2020  ? AST 27 11/22/2020  ? NA 140 11/22/2020  ? K 4.8 11/22/2020  ? CL 102 11/22/2020  ? CREATININE 1.15 11/22/2020  ? BUN 20 11/22/2020  ? CO2 24 11/22/2020  ? PSA 0.21 02/15/2014  ? ? ? ?Assessment & Plan:  ? ?Problem List Items Addressed This Visit   ? ?  ? Cardiovascular and Mediastinum  ? Essential hypertension, benign  ?  Stable.  Continue enalapril and alert pain.  Refilled both enalapril and amlodipine today. ? ?  ?  ? Relevant Medications  ? amLODipine (NORVASC) 10 MG tablet  ? enalapril (VASOTEC) 20 MG tablet  ? rosuvastatin (CRESTOR) 10 MG tablet  ? Other Relevant Orders  ? CMP14+EGFR  ?  ? Genitourinary  ? BPH (benign prostatic hyperplasia)  ? Relevant Orders  ? PSA  ?  ? Other  ?  Hyperlipidemia - Primary  ?  Labs ordered.  Continue Crestor. ? ?  ?  ? Relevant Medications  ? amLODipine (NORVASC) 10 MG tablet  ? enalapril (VASOTEC) 20 MG tablet  ? rosuvastatin (CRESTOR) 10 MG tablet  ? Other Relevant Orders  ? Lipid panel  ? Healthcare maintenance  ?  Phone number Dr. Roseanne Kaufman office given to the patient so that he can call and schedule his colonoscopy. ? ?  ?  ? ?Other Visit Diagnoses   ? ? Screening for deficiency anemia      ? Relevant Orders  ? CBC  ? ?  ? ? ?Meds ordered this encounter  ?Medications  ? amLODipine (NORVASC) 10 MG tablet  ?  Sig: Take 1 tablet (10 mg total) by mouth daily.  ?  Dispense:  30 tablet  ?  Refill:  11  ? enalapril (VASOTEC) 20 MG tablet  ?  Sig: Take 1 tablet (20 mg total) by mouth 2 (two) times daily.  ?  Dispense:  60 tablet  ?  Refill:  11  ? rosuvastatin (CRESTOR) 10 MG tablet  ?  Sig: Take 1 tablet (10 mg total) by mouth daily.  ?  Dispense:  30 tablet  ?  Refill:  11  ? ? ?Follow-up:  Return in about 6 months (around 07/23/2022). ? ?Thersa Salt DO ?[facility] ? ?

## 2022-01-22 LAB — CMP14+EGFR
ALT: 42 IU/L (ref 0–44)
AST: 37 IU/L (ref 0–40)
Albumin/Globulin Ratio: 1.7 (ref 1.2–2.2)
Albumin: 4.5 g/dL (ref 3.8–4.8)
Alkaline Phosphatase: 69 IU/L (ref 44–121)
BUN/Creatinine Ratio: 14 (ref 10–24)
BUN: 17 mg/dL (ref 8–27)
Bilirubin Total: 0.5 mg/dL (ref 0.0–1.2)
CO2: 21 mmol/L (ref 20–29)
Calcium: 9.6 mg/dL (ref 8.6–10.2)
Chloride: 105 mmol/L (ref 96–106)
Creatinine, Ser: 1.22 mg/dL (ref 0.76–1.27)
Globulin, Total: 2.6 g/dL (ref 1.5–4.5)
Glucose: 101 mg/dL — ABNORMAL HIGH (ref 70–99)
Potassium: 4.8 mmol/L (ref 3.5–5.2)
Sodium: 141 mmol/L (ref 134–144)
Total Protein: 7.1 g/dL (ref 6.0–8.5)
eGFR: 67 mL/min/{1.73_m2} (ref >59)

## 2022-01-22 LAB — LIPID PANEL
Chol/HDL Ratio: 2.6 ratio (ref 0.0–5.0)
Cholesterol, Total: 127 mg/dL (ref 100–199)
HDL: 48 mg/dL (ref >39)
LDL Chol Calc (NIH): 65 mg/dL (ref 0–99)
Triglycerides: 65 mg/dL (ref 0–149)
VLDL Cholesterol Cal: 14 mg/dL (ref 5–40)

## 2022-01-22 LAB — PSA: Prostate Specific Ag, Serum: 0.2 ng/mL (ref 0.0–4.0)

## 2022-01-22 LAB — CBC
Hematocrit: 45 % (ref 37.5–51.0)
Hemoglobin: 15.3 g/dL (ref 13.0–17.7)
MCH: 28.9 pg (ref 26.6–33.0)
MCHC: 34 g/dL (ref 31.5–35.7)
MCV: 85 fL (ref 79–97)
Platelets: 236 10*3/uL (ref 150–450)
RBC: 5.29 x10E6/uL (ref 4.14–5.80)
RDW: 12.3 % (ref 11.6–15.4)
WBC: 7.3 10*3/uL (ref 3.4–10.8)

## 2022-01-27 ENCOUNTER — Ambulatory Visit: Payer: BC Managed Care – PPO | Admitting: Dermatology

## 2022-01-27 ENCOUNTER — Encounter: Payer: Self-pay | Admitting: Dermatology

## 2022-01-27 DIAGNOSIS — Z1283 Encounter for screening for malignant neoplasm of skin: Secondary | ICD-10-CM

## 2022-01-27 DIAGNOSIS — D485 Neoplasm of uncertain behavior of skin: Secondary | ICD-10-CM | POA: Diagnosis not present

## 2022-01-27 DIAGNOSIS — C44612 Basal cell carcinoma of skin of right upper limb, including shoulder: Secondary | ICD-10-CM | POA: Diagnosis not present

## 2022-01-27 DIAGNOSIS — C44319 Basal cell carcinoma of skin of other parts of face: Secondary | ICD-10-CM

## 2022-01-27 DIAGNOSIS — D045 Carcinoma in situ of skin of trunk: Secondary | ICD-10-CM | POA: Diagnosis not present

## 2022-01-27 DIAGNOSIS — L570 Actinic keratosis: Secondary | ICD-10-CM | POA: Diagnosis not present

## 2022-01-27 DIAGNOSIS — Z85828 Personal history of other malignant neoplasm of skin: Secondary | ICD-10-CM

## 2022-01-27 DIAGNOSIS — L739 Follicular disorder, unspecified: Secondary | ICD-10-CM

## 2022-01-27 NOTE — Patient Instructions (Signed)

## 2022-02-02 ENCOUNTER — Telehealth: Payer: Self-pay

## 2022-02-02 NOTE — Telephone Encounter (Signed)
-----   Message from Lavonna Monarch, MD sent at 01/30/2022  5:24 AM EDT ----- ?Schedule surgery with Dr. Darene Lamer ?

## 2022-02-02 NOTE — Telephone Encounter (Signed)
Path to patient. Made patient appointment to have lesions treated.  ?

## 2022-02-02 NOTE — Telephone Encounter (Signed)
Phone call to patient with his pathology results. Voicemail left for patient to give the office a call back.  ?

## 2022-02-14 ENCOUNTER — Encounter: Payer: Self-pay | Admitting: Dermatology

## 2022-02-14 NOTE — Progress Notes (Signed)
New Patient   Subjective  Ethan Walls is a 61 y.o. male who presents for the following: New Patient (Initial Visit) (Patient here today to have lesions on his right and left jaw line checked x 1 year. Per patient some bleeding when his hair is growing back from shaving. Patient states he also has a few lesions on his mid and lower back x 1 year that he would like checked. Per patient the lesions on his back itch. Patient also wants to know if there is a shampoo that he can use for sores, or dry skin on his scalp x years. Personal history of non mole skin cancer. No F/H of atypical moles, melanoma or non mole. ).  Years since he was last seen, multiple new growths and other skin issues he would like to discuss. Location:  Duration:  Quality:  Associated Signs/Symptoms: Modifying Factors:  Severity:  Timing: Context:    The following portions of the chart were reviewed this encounter and updated as appropriate:  Tobacco  Allergies  Meds  Problems  Med Hx  Surg Hx  Fam Hx      Objective  Well appearing patient in no apparent distress; mood and affect are within normal limits. Waist up skin examination: 1 atypical pigmented lesion right lower deltoid and multiple probable nonmelanoma skin cancers will be biopsied.  All pigmented spots checked with dermoscopy.  Right Deltoid Superior Irregular 7 mm 2 toned macule with dermoscopic atypia       Right Deltoid Inferior Pearly 4 mm papule, rule out BCC       Right Supraclavicular Area Waxy 1 cm crust, superficial carcinoma       Right Shoulder - Posterior Pearly flat 1 cm crust       Right Posterior Mandible Pearly flat 1 cm crust       Head - Anterior (Face) Hornlike 3 mm pink crust plus multiple flatter facial actinic keratoses    All skin waist up examined.   Assessment & Plan  Neoplasm of uncertain behavior of skin (5) Right Deltoid Superior  Skin / nail biopsy Type of biopsy:  tangential   Informed consent: discussed and consent obtained   Timeout: patient name, date of birth, surgical site, and procedure verified   Procedure prep:  Patient was prepped and draped in usual sterile fashion (Non sterile) Prep type:  Chlorhexidine Anesthesia: the lesion was anesthetized in a standard fashion   Anesthetic:  1% lidocaine w/ epinephrine 1-100,000 local infiltration Instrument used: flexible razor blade   Hemostasis achieved with: ferric subsulfate   Outcome: patient tolerated procedure well   Post-procedure details: sterile dressing applied and wound care instructions given   Dressing type: bandage and petrolatum    Specimen 1 - Surgical pathology Differential Diagnosis: R/O Atypia  Check Margins: No  Right Deltoid Inferior  Skin / nail biopsy Type of biopsy: tangential   Informed consent: discussed and consent obtained   Timeout: patient name, date of birth, surgical site, and procedure verified   Procedure prep:  Patient was prepped and draped in usual sterile fashion (Non sterile) Prep type:  Chlorhexidine Anesthesia: the lesion was anesthetized in a standard fashion   Anesthetic:  1% lidocaine w/ epinephrine 1-100,000 local infiltration Instrument used: flexible razor blade   Hemostasis achieved with: ferric subsulfate   Outcome: patient tolerated procedure well   Post-procedure details: sterile dressing applied and wound care instructions given   Dressing type: bandage and petrolatum    Specimen 2 -  Surgical pathology Differential Diagnosis: R/O BCC vs SCC  Check Margins: No  Right Supraclavicular Area  Skin / nail biopsy Type of biopsy: tangential   Informed consent: discussed and consent obtained   Timeout: patient name, date of birth, surgical site, and procedure verified   Procedure prep:  Patient was prepped and draped in usual sterile fashion (Non sterile) Prep type:  Chlorhexidine Anesthesia: the lesion was anesthetized in a standard  fashion   Anesthetic:  1% lidocaine w/ epinephrine 1-100,000 local infiltration Instrument used: flexible razor blade   Hemostasis achieved with: ferric subsulfate   Outcome: patient tolerated procedure well   Post-procedure details: sterile dressing applied and wound care instructions given   Dressing type: bandage and petrolatum    Specimen 3 - Surgical pathology Differential Diagnosis: R/O BCC vs SCC  Check Margins: No  Right Shoulder - Posterior  Skin / nail biopsy Type of biopsy: tangential   Informed consent: discussed and consent obtained   Timeout: patient name, date of birth, surgical site, and procedure verified   Procedure prep:  Patient was prepped and draped in usual sterile fashion (Non sterile) Prep type:  Chlorhexidine Anesthesia: the lesion was anesthetized in a standard fashion   Anesthetic:  1% lidocaine w/ epinephrine 1-100,000 local infiltration Instrument used: flexible razor blade   Hemostasis achieved with: ferric subsulfate   Outcome: patient tolerated procedure well   Post-procedure details: sterile dressing applied and wound care instructions given   Dressing type: bandage and petrolatum    Specimen 4 - Surgical pathology Differential Diagnosis: R/O BCC vs SCC  Check Margins: No  Right Posterior Mandible  Skin / nail biopsy Type of biopsy: tangential   Informed consent: discussed and consent obtained   Timeout: patient name, date of birth, surgical site, and procedure verified   Procedure prep:  Patient was prepped and draped in usual sterile fashion (Non sterile) Prep type:  Chlorhexidine Anesthesia: the lesion was anesthetized in a standard fashion   Anesthetic:  1% lidocaine w/ epinephrine 1-100,000 local infiltration Instrument used: flexible razor blade   Hemostasis achieved with: ferric subsulfate   Outcome: patient tolerated procedure well   Post-procedure details: sterile dressing applied and wound care instructions given   Dressing  type: petrolatum    Specimen 5 - Surgical pathology Differential Diagnosis: R/O BCC vs SCC  Check Margins: No  Actinic keratosis Head - Anterior (Face)  Patient will give the office a call back in the Fall to get a prescription for [place].   Encounter for screening for malignant neoplasm of skin  Encouraged annual skin examination.

## 2022-03-06 ENCOUNTER — Other Ambulatory Visit: Payer: Self-pay

## 2022-03-06 DIAGNOSIS — I10 Essential (primary) hypertension: Secondary | ICD-10-CM

## 2022-03-06 MED ORDER — ENALAPRIL MALEATE 20 MG PO TABS: 20.0000 mg | ORAL_TABLET | Freq: Two times a day (BID) | ORAL | 3 refills | Status: DC

## 2022-03-06 MED ORDER — ROSUVASTATIN CALCIUM 10 MG PO TABS
10.0000 mg | ORAL_TABLET | Freq: Every day | ORAL | 3 refills | Status: DC
Start: 2022-03-06 — End: 2022-08-31

## 2022-03-06 MED ORDER — AMLODIPINE BESYLATE 10 MG PO TABS
10.0000 mg | ORAL_TABLET | Freq: Every day | ORAL | 3 refills | Status: DC
Start: 2022-03-06 — End: 2022-08-31

## 2022-03-12 ENCOUNTER — Encounter: Payer: Self-pay | Admitting: Dermatology

## 2022-03-12 ENCOUNTER — Ambulatory Visit (INDEPENDENT_AMBULATORY_CARE_PROVIDER_SITE_OTHER): Payer: BC Managed Care – PPO | Admitting: Dermatology

## 2022-03-12 DIAGNOSIS — C44612 Basal cell carcinoma of skin of right upper limb, including shoulder: Secondary | ICD-10-CM

## 2022-03-12 DIAGNOSIS — D0461 Carcinoma in situ of skin of right upper limb, including shoulder: Secondary | ICD-10-CM

## 2022-03-12 DIAGNOSIS — C44319 Basal cell carcinoma of skin of other parts of face: Secondary | ICD-10-CM | POA: Diagnosis not present

## 2022-03-12 NOTE — Patient Instructions (Signed)

## 2022-04-05 ENCOUNTER — Encounter: Payer: Self-pay | Admitting: Dermatology

## 2022-04-05 NOTE — Progress Notes (Signed)
Follow-Up Visit   Subjective  Ethan Walls is a 61 y.o. male who presents for the following: Procedure (Patient here today for treatment of BCC x 3 right deltoid inferior, right shoulder posterior, right posterior mandible and CIS x 1 right supraclavicular area. ).  Multiple biopsy-proven nonmelanoma skin cancers Location:  Duration:  Quality:  Associated Signs/Symptoms: Modifying Factors:  Severity:  Timing: Context:   Objective  Well appearing patient in no apparent distress; mood and affect are within normal limits. Right Deltoid inferior Biopsy site identified by nurse and me  Right Supraclavicular area Biopsy site identified by nurse and me  Right Shoulder- Posterior Biopsy site identified by nurse and me  Right Posterior Mandible Biopsy site identified by nurse and me and patient    All skin waist up examined.   Assessment & Plan    Basal cell carcinoma of skin of right upper limb, including shoulder Right Deltoid inferior  Destruction of lesion Complexity: simple   Destruction method: electrodesiccation and curettage   Informed consent: discussed and consent obtained   Timeout:  patient name, date of birth, surgical site, and procedure verified Anesthesia: the lesion was anesthetized in a standard fashion   Anesthetic:  1% lidocaine w/ epinephrine 1-100,000 local infiltration Curettage performed in three different directions: Yes   Electrodesiccation performed over the curetted area: Yes   Curettage cycles:  3 Lesion length (cm):  1.5 Lesion width (cm):  1.5 Margin per side (cm):  0 Final wound size (cm):  1.5 Hemostasis achieved with:  aluminum chloride Outcome: patient tolerated procedure well with no complications   Post-procedure details: wound care instructions given    Squamous cell carcinoma in situ (SCCIS) of skin of right shoulder (2) Right Supraclavicular area  Destruction of lesion Complexity: simple   Destruction method:  electrodesiccation and curettage   Informed consent: discussed and consent obtained   Timeout:  patient name, date of birth, surgical site, and procedure verified Anesthesia: the lesion was anesthetized in a standard fashion   Anesthetic:  1% lidocaine w/ epinephrine 1-100,000 local infiltration Curettage performed in three different directions: Yes   Electrodesiccation performed over the curetted area: Yes   Curettage cycles:  3 Lesion length (cm):  1.5 Lesion width (cm):  1.5 Margin per side (cm):  0 Final wound size (cm):  1.5 Hemostasis achieved with:  aluminum chloride Outcome: patient tolerated procedure well with no complications   Post-procedure details: wound care instructions given    Right Shoulder- Posterior  Destruction of lesion Complexity: simple   Destruction method: electrodesiccation and curettage   Informed consent: discussed and consent obtained   Timeout:  patient name, date of birth, surgical site, and procedure verified Anesthesia: the lesion was anesthetized in a standard fashion   Anesthetic:  1% lidocaine w/ epinephrine 1-100,000 local infiltration Curettage performed in three different directions: Yes   Electrodesiccation performed over the curetted area: Yes   Curettage cycles:  3 Lesion length (cm):  1.5 Lesion width (cm):  1.5 Margin per side (cm):  0 Final wound size (cm):  1.5 Hemostasis achieved with:  aluminum chloride Outcome: patient tolerated procedure well with no complications   Post-procedure details: wound care instructions given    Basal cell carcinoma of right cheek Right Posterior Mandible  Destruction of lesion Complexity: simple   Destruction method: electrodesiccation and curettage   Informed consent: discussed and consent obtained   Timeout:  patient name, date of birth, surgical site, and procedure verified Anesthesia: the lesion was  anesthetized in a standard fashion   Anesthetic:  1% lidocaine w/ epinephrine 1-100,000  local infiltration Curettage performed in three different directions: Yes   Electrodesiccation performed over the curetted area: Yes   Curettage cycles:  3 Lesion length (cm):  2.1 Lesion width (cm):  2.1 Margin per side (cm):  0 Final wound size (cm):  2.1 Hemostasis achieved with:  aluminum chloride Outcome: patient tolerated procedure well with no complications   Post-procedure details: wound care instructions given        I, Lavonna Monarch, MD, have reviewed all documentation for this visit.  The documentation on 04/05/22 for the exam, diagnosis, procedures, and orders are all accurate and complete.

## 2022-04-13 ENCOUNTER — Ambulatory Visit: Payer: BC Managed Care – PPO

## 2022-06-02 ENCOUNTER — Encounter: Payer: Self-pay | Admitting: *Deleted

## 2022-06-02 ENCOUNTER — Telehealth: Payer: Self-pay | Admitting: *Deleted

## 2022-06-02 NOTE — Telephone Encounter (Signed)
Pt was scheduled for nurse visit on 7/17 and was cancelled. I don't see he has been mailed a questionnaire. Pt aware will be getting one in the mail and to send back

## 2022-06-12 ENCOUNTER — Encounter: Payer: Self-pay | Admitting: *Deleted

## 2022-06-12 NOTE — Patient Instructions (Signed)
  Procedure: colonoscopy  Estimated body mass index is 31.75 kg/m as calculated from the following:   Height as of this encounter: '5\' 9"'$  (1.753 m).   Weight as of this encounter: 215 lb (97.5 kg).   Have you had a colonoscopy before?  12/19/15, Dr. Gala Romney  Do you have family history of colon cancer  no  Do you have a family history of polyps? no  Previous colonoscopy with polyps removed? yes  Do you have a history colorectal cancer?   no  Are you diabetic?  no  Do you have a prosthetic or mechanical heart valve? no  Do you have a pacemaker/defibrillator?   no  Have you had endocarditis/atrial fibrillation?  no  Do you use supplemental oxygen/CPAP?  no  Have you had joint replacement within the last 12 months?  no  Do you tend to be constipated or have to use laxatives?  no   Do you have history of alcohol use? If yes, how much and how often.  no  Do you have history or are you using drugs? If yes, what do are you  using?  no  Have you ever had a stroke/heart attack?  no  Have you ever had a heart or other vascular stent placed,?no  Do you take weight loss medication? no  Do you take any blood-thinning medications such as: (Plavix, aspirin, Coumadin, Aggrenox, Brilinta, Xarelto, Eliquis, Pradaxa, Savaysa or Effient) no  If yes we need the name, milligram, dosage and who is prescribing doctor:               Current Outpatient Medications  Medication Sig Dispense Refill   amLODipine (NORVASC) 10 MG tablet Take 1 tablet (10 mg total) by mouth daily. 90 tablet 3   Cholecalciferol (VITAMIN D) 50 MCG (2000 UT) CAPS Take by mouth.     enalapril (VASOTEC) 20 MG tablet Take 1 tablet (20 mg total) by mouth 2 (two) times daily. 180 tablet 3   rosuvastatin (CRESTOR) 10 MG tablet Take 1 tablet (10 mg total) by mouth daily. 90 tablet 3   No current facility-administered medications for this visit.    No Known Allergies  Pharmacy: [facility]

## 2022-06-23 NOTE — Progress Notes (Signed)
Ok to schedule. ASA 2.  

## 2022-06-23 NOTE — Progress Notes (Signed)
Will call pt to schedule once we receive Dr. Roseanne Kaufman future schedule

## 2022-07-01 ENCOUNTER — Encounter: Payer: Self-pay | Admitting: *Deleted

## 2022-07-01 MED ORDER — PEG 3350-KCL-NA BICARB-NACL 420 G PO SOLR: 4000.0000 mL | Freq: Once | ORAL | 0 refills | Status: AC

## 2022-07-01 NOTE — Progress Notes (Signed)
Spoke with pt. Scheduled for 11/6 at 8:30am. Aware will mail prep instructions. Rx for prep sent to [facility].

## 2022-07-29 ENCOUNTER — Encounter: Payer: Self-pay | Admitting: Family Medicine

## 2022-07-29 ENCOUNTER — Ambulatory Visit (HOSPITAL_COMMUNITY)
Admission: RE | Admit: 2022-07-29 | Discharge: 2022-07-29 | Disposition: A | Discharge status: Home / Self Care | Payer: BC Managed Care – PPO | Source: Ambulatory Visit | Attending: Family Medicine | Admitting: Family Medicine

## 2022-07-29 ENCOUNTER — Ambulatory Visit: Payer: BC Managed Care – PPO | Admitting: Family Medicine

## 2022-07-29 DIAGNOSIS — Z0000 Encounter for general adult medical examination without abnormal findings: Secondary | ICD-10-CM

## 2022-07-29 DIAGNOSIS — G8929 Other chronic pain: Secondary | ICD-10-CM

## 2022-07-29 DIAGNOSIS — Z130 Encounter for screening for diseases of the blood and blood-forming organs and certain disorders involving the immune mechanism: Secondary | ICD-10-CM | POA: Diagnosis not present

## 2022-07-29 DIAGNOSIS — M25562 Pain in left knee: Secondary | ICD-10-CM

## 2022-07-29 DIAGNOSIS — Z23 Encounter for immunization: Secondary | ICD-10-CM | POA: Diagnosis not present

## 2022-07-29 DIAGNOSIS — I10 Essential (primary) hypertension: Secondary | ICD-10-CM

## 2022-07-29 DIAGNOSIS — E7800 Pure hypercholesterolemia, unspecified: Secondary | ICD-10-CM | POA: Diagnosis not present

## 2022-07-29 NOTE — Patient Instructions (Signed)
Continue your medications.  Labs today. Xray at the hospital.  Follow up in 6 months.  Take care  Dr. Lacinda Axon

## 2022-07-30 DIAGNOSIS — G8929 Other chronic pain: Secondary | ICD-10-CM | POA: Insufficient documentation

## 2022-07-30 LAB — CBC
Hematocrit: 43 % (ref 37.5–51.0)
Hemoglobin: 14.9 g/dL (ref 13.0–17.7)
MCH: 30 pg (ref 26.6–33.0)
MCHC: 34.7 g/dL (ref 31.5–35.7)
MCV: 87 fL (ref 79–97)
Platelets: 296 10*3/uL (ref 150–450)
RBC: 4.96 x10E6/uL (ref 4.14–5.80)
RDW: 12.2 % (ref 11.6–15.4)
WBC: 8.3 10*3/uL (ref 3.4–10.8)

## 2022-07-30 LAB — LIPID PANEL
Chol/HDL Ratio: 2.7 ratio (ref 0.0–5.0)
Cholesterol, Total: 147 mg/dL (ref 100–199)
HDL: 54 mg/dL (ref >39)
LDL Chol Calc (NIH): 75 mg/dL (ref 0–99)
Triglycerides: 97 mg/dL (ref 0–149)
VLDL Cholesterol Cal: 18 mg/dL (ref 5–40)

## 2022-07-30 LAB — CMP14+EGFR
ALT: 44 IU/L (ref 0–44)
AST: 35 IU/L (ref 0–40)
Albumin/Globulin Ratio: 2 (ref 1.2–2.2)
Albumin: 4.9 g/dL (ref 3.9–4.9)
Alkaline Phosphatase: 71 IU/L (ref 44–121)
BUN/Creatinine Ratio: 16 (ref 10–24)
BUN: 20 mg/dL (ref 8–27)
Bilirubin Total: 0.4 mg/dL (ref 0.0–1.2)
CO2: 24 mmol/L (ref 20–29)
Calcium: 9.6 mg/dL (ref 8.6–10.2)
Chloride: 103 mmol/L (ref 96–106)
Creatinine, Ser: 1.24 mg/dL (ref 0.76–1.27)
Globulin, Total: 2.5 g/dL (ref 1.5–4.5)
Glucose: 91 mg/dL (ref 70–99)
Potassium: 5.1 mmol/L (ref 3.5–5.2)
Sodium: 140 mmol/L (ref 134–144)
Total Protein: 7.4 g/dL (ref 6.0–8.5)
eGFR: 65 mL/min/{1.73_m2} (ref >59)

## 2022-07-30 MED ORDER — MELOXICAM 15 MG PO TABS: 15.0000 mg | ORAL_TABLET | Freq: Every day | ORAL | 0 refills | Status: DC | PRN

## 2022-07-30 NOTE — Assessment & Plan Note (Signed)
Well controlled. Continue current medications  

## 2022-07-30 NOTE — Assessment & Plan Note (Signed)
X-ray was obtained and revealed mild osteoarthritis.  Meloxicam as directed.

## 2022-07-30 NOTE — Progress Notes (Signed)
Subjective:  Patient ID: Ethan Walls, male    DOB: 1960/10/28  Age: 61 y.o. MRN: [id number]  CC: Chief Complaint  Patient presents with   Hypertension    Pt has colonoscopy scheduled for Monday. No issues with blood pressure. Having some pain in right rib area-pt states he has had some family stress and also bass fishing on Tuesday and Thursday-began hurt on Wednesday.     HPI:  61 year old male with hypertension, BPH, hyperlipidemia presents for follow-up.  Patient has some ongoing issues.  He reports recent right-sided rib pain.  He has recently been fishing and has also bent over into his trash can outside.  He is unsure if this is the culprit.  His pain is currently 1/10 in severity.  Patient also reports that he has had ongoing left knee pain.  Has been going on since February.  He states that he has periods of time where his knee seems to lock up.  No instability.  Pain is mild to moderate.  Hypertension is well controlled on amlodipine and enalapril.  Desires flu shot today.  Has colonoscopy scheduled.  Lipids have been well controlled on Crestor.  Patient Active Problem List   Diagnosis Date Noted   Chronic pain of left knee 07/30/2022   Healthcare maintenance 01/21/2022   BPH (benign prostatic hyperplasia) 07/10/2021   History of colonic polyps    Essential hypertension, benign 02/18/2014   Hyperlipidemia 02/18/2014    Social Hx   Social History   Socioeconomic History   Marital status: Married    Spouse name: Not on file   Number of children: Not on file   Years of education: Not on file   Highest education level: Not on file  Occupational History   Not on file  Tobacco Use   Smoking status: Never   Smokeless tobacco: Never  Substance and Sexual Activity   Alcohol use: No   Drug use: No   Sexual activity: Not on file  Other Topics Concern   Not on file  Social History Narrative   Not on file   Social Determinants of Health   Financial  Resource Strain: Not on file  Food Insecurity: Not on file  Transportation Needs: Not on file  Physical Activity: Not on file  Stress: Not on file  Social Connections: Not on file    Review of Systems Per HPI  Objective:  BP 132/70   Pulse 71   Wt 225 lb (102.1 kg)   SpO2 98%   BMI 33.23 kg/m      07/29/2022    8:42 AM 06/12/2022   10:02 AM 01/21/2022    8:38 AM  BP/Weight  Systolic BP 169  450  Diastolic BP 70  86  Wt. (Lbs) 225 215 217.4  BMI 33.23 kg/m2 31.75 kg/m2 32.1 kg/m2    Physical Exam Vitals and nursing note reviewed.  Constitutional:      General: He is not in acute distress.    Appearance: Normal appearance.  HENT:     Head: Normocephalic and atraumatic.  Eyes:     General:        Right eye: No discharge.        Left eye: No discharge.     Conjunctiva/sclera: Conjunctivae normal.  Cardiovascular:     Rate and Rhythm: Normal rate and regular rhythm.  Pulmonary:     Effort: Pulmonary effort is normal.     Breath sounds: Normal breath sounds. No wheezing,  rhonchi or rales.  Musculoskeletal:     Comments: Left knee -no joint line tenderness.  Ligaments intact.  Neurological:     Mental Status: He is alert.  Psychiatric:        Mood and Affect: Mood normal.        Behavior: Behavior normal.     Lab Results  Component Value Date   WBC 8.3 07/29/2022   HGB 14.9 07/29/2022   HCT 43.0 07/29/2022   PLT 296 07/29/2022   GLUCOSE 91 07/29/2022   CHOL 147 07/29/2022   TRIG 97 07/29/2022   HDL 54 07/29/2022   LDLCALC 75 07/29/2022   ALT 44 07/29/2022   AST 35 07/29/2022   NA 140 07/29/2022   K 5.1 07/29/2022   CL 103 07/29/2022   CREATININE 1.24 07/29/2022   BUN 20 07/29/2022   CO2 24 07/29/2022   PSA 0.21 02/15/2014     Assessment & Plan:   Problem List Items Addressed This Visit       Cardiovascular and Mediastinum   Essential hypertension, benign    Well-controlled.  Continue current medications.      Relevant Orders    CMP14+EGFR (Completed)     Other   Hyperlipidemia    Well-controlled.  Lipid panel today.  Continue Crestor.      Relevant Orders   Lipid panel (Completed)   Healthcare maintenance    Has upcoming colonoscopy.  Flu shot given today.      Chronic pain of left knee    X-ray was obtained and revealed mild osteoarthritis.  Meloxicam as directed.      Relevant Medications   meloxicam (MOBIC) 15 MG tablet   Other Relevant Orders   DG Knee Complete 4 Views Left (Completed)   Other Visit Diagnoses     Screening for deficiency anemia       Relevant Orders   CBC (Completed)   Need for vaccination       Relevant Orders   Flu Vaccine QUAD 6+ mos PF IM (Fluarix Quad PF) (Completed)       Meds ordered this encounter  Medications   meloxicam (MOBIC) 15 MG tablet    Sig: Take 1 tablet (15 mg total) by mouth daily as needed for pain.    Dispense:  30 tablet    Refill:  0    Follow-up:  Return in about 6 months (around 01/27/2023).  [place]

## 2022-07-30 NOTE — Assessment & Plan Note (Signed)
Has upcoming colonoscopy.  Flu shot given today.

## 2022-07-30 NOTE — Assessment & Plan Note (Signed)
Well-controlled.  Lipid panel today.  Continue Crestor.

## 2022-08-03 ENCOUNTER — Ambulatory Visit (HOSPITAL_COMMUNITY): Payer: BC Managed Care – PPO | Admitting: Anesthesiology

## 2022-08-03 ENCOUNTER — Encounter (HOSPITAL_COMMUNITY)
Admission: RE | Disposition: A | Discharge status: Home / Self Care | Payer: Self-pay | Source: Home / Self Care | Attending: Internal Medicine

## 2022-08-03 ENCOUNTER — Encounter (HOSPITAL_COMMUNITY): Payer: Self-pay | Admitting: Internal Medicine

## 2022-08-03 ENCOUNTER — Ambulatory Visit (HOSPITAL_COMMUNITY)
Admission: RE | Admit: 2022-08-03 | Discharge: 2022-08-03 | Disposition: A | Discharge status: Home / Self Care | Payer: BC Managed Care – PPO | Attending: Internal Medicine | Admitting: Internal Medicine

## 2022-08-03 ENCOUNTER — Other Ambulatory Visit: Payer: Self-pay

## 2022-08-03 DIAGNOSIS — M1990 Unspecified osteoarthritis, unspecified site: Secondary | ICD-10-CM | POA: Diagnosis not present

## 2022-08-03 DIAGNOSIS — Z09 Encounter for follow-up examination after completed treatment for conditions other than malignant neoplasm: Secondary | ICD-10-CM

## 2022-08-03 DIAGNOSIS — Z86010 Personal history of colonic polyps: Secondary | ICD-10-CM | POA: Insufficient documentation

## 2022-08-03 DIAGNOSIS — Z1211 Encounter for screening for malignant neoplasm of colon: Secondary | ICD-10-CM | POA: Insufficient documentation

## 2022-08-03 DIAGNOSIS — I10 Essential (primary) hypertension: Secondary | ICD-10-CM | POA: Insufficient documentation

## 2022-08-03 DIAGNOSIS — K5730 Diverticulosis of large intestine without perforation or abscess without bleeding: Secondary | ICD-10-CM | POA: Diagnosis not present

## 2022-08-03 HISTORY — PX: COLONOSCOPY WITH PROPOFOL: SHX5780

## 2022-08-03 SURGERY — COLONOSCOPY WITH PROPOFOL
Anesthesia: General

## 2022-08-03 MED ORDER — PROPOFOL 500 MG/50ML IV EMUL
INTRAVENOUS | Status: DC | PRN
  Administered 2022-08-03: 150 ug/kg/min via INTRAVENOUS

## 2022-08-03 MED ORDER — LIDOCAINE HCL (CARDIAC) PF 100 MG/5ML IV SOSY
PREFILLED_SYRINGE | INTRAVENOUS | Status: DC | PRN
  Administered 2022-08-03: 50 mg via INTRAVENOUS

## 2022-08-03 MED ORDER — PROPOFOL 10 MG/ML IV BOLUS
INTRAVENOUS | Status: DC | PRN
  Administered 2022-08-03: 100 mg via INTRAVENOUS

## 2022-08-03 MED ORDER — LACTATED RINGERS IV SOLN: INTRAVENOUS | Status: DC

## 2022-08-03 NOTE — H&P (Signed)
$'@LOGO'p$ @   Primary Care Physician:  Coral Spikes, DO Primary Gastroenterologist:  Dr. Gala Romney  Pre-Procedure History & Physical: HPI:  Ethan Walls is a 61 y.o. male here for  surveillance colonoscopy.  History of 4 small adenomas removed his colon 2017.  Bowel symptoms at this time.  Past Medical History:  Diagnosis Date   BCC (basal cell carcinoma of skin) 03/03/2012   upper right shoulder tx with bx   Hiccups    Hypertension    Nodular basal cell carcinoma (BCC) 04/10/2015   Right Jawline (tx p bx)   Pigmented basal cell carcinoma (BCC) 07/06/2012   right clavicale tx with bx   SCCA (squamous cell carcinoma) of skin 07/05/2018   Left Forearm, Mid (in situ) (curet and 5FU)   SCCA (squamous cell carcinoma) of skin 07/05/2018   Right Front Scalp (in situ) (curet and 5FU)   SCCA (squamous cell carcinoma) of skin 01/27/2022   Right Supraclavicular Area(CX3)   Squamous cell carcinoma of skin 11/21/2008   Right Temple (well diff) (curet and 5FU)   Superficial basal cell carcinoma (BCC) 10/29/2008   right post shoulder cx3 68f   Superficial basal cell carcinoma (BCC) 10/29/2008   left ant shoulder tx with bx   Superficial basal cell carcinoma (BCC) 10/29/2008   left side of the neck cx3 557f  Superficial basal cell carcinoma (BCC) 01/07/2009   upper back tx with bx   Superficial basal cell carcinoma (BCC) 03/03/2012   right shoulder below previus bx tx with bx   Superficial basal cell carcinoma (BCC) 07/06/2012   left shoulder outer tx with bx   Superficial basal cell carcinoma (BCC) 10/31/2012   Left Shouder Outer (tx p bx)   Superficial basal cell carcinoma (BCC) 11/21/2014   Left Neck (curet and 5FU)   Superficial basal cell carcinoma (BCC) 07/05/2018   Right Neck Inferior (curet and 5FU)   Superficial basal cell carcinoma (BCC) 01/27/2022   Right Shoulder - posterior(CX3)   Superficial basal cell carcinoma (BCC) 01/27/2022   Right Posterior Mandible(CX3)   Superficial  nodular basal cell carcinoma (BCC) 07/05/2018   Left Mid Back, Lower (curet and 5FU)   Superficial nodular basal cell carcinoma (BCC) 07/05/2018   Right Sideburn (curet and 5FU)   Superficial nodular basal cell carcinoma (BCC) 01/27/2022   Right Deltoid Inferior(CX3)    Past Surgical History:  Procedure Laterality Date   COLONOSCOPY N/A 12/19/2015   Procedure: COLONOSCOPY;  Surgeon: RoDaneil DolinMD;  Location: [facility];  Service: Endoscopy;  Laterality: N/A;  2:00 PM -  moved to 2:15 - office to notify   Left index finger      Prior to Admission medications   Medication Sig Start Date End Date Taking? Authorizing Provider  amLODipine (NORVASC) 10 MG tablet Take 1 tablet (10 mg total) by mouth daily. 03/06/22  Yes Cook, Jayce G, DO  cholecalciferol (VITAMIN D3) 25 MCG (1000 UNIT) tablet Take 1,000 Units by mouth daily.   Yes [provider]  enalapril (VASOTEC) 20 MG tablet Take 1 tablet (20 mg total) by mouth 2 (two) times daily. 03/06/22  Yes Cook, Jayce G, DO  rosuvastatin (CRESTOR) 10 MG tablet Take 1 tablet (10 mg total) by mouth daily. 03/06/22  Yes Cook, Jayce G, DO  meloxicam (MOBIC) 15 MG tablet Take 1 tablet (15 mg total) by mouth daily as needed for pain. 07/30/22   CoCoral SpikesDO    Allergies as of 07/01/2022   (  No Known Allergies)    Family History  Problem Relation Age of Onset   Colon cancer Neg Hx     Social History   Socioeconomic History   Marital status: Married    Spouse name: Not on file   Number of children: Not on file   Years of education: Not on file   Highest education level: Not on file  Occupational History   Not on file  Tobacco Use   Smoking status: Never   Smokeless tobacco: Never  Vaping Use   Vaping Use: Never used  Substance and Sexual Activity   Alcohol use: No   Drug use: No   Sexual activity: Not on file  Other Topics Concern   Not on file  Social History Narrative   Not on file   Social Determinants of Health    Financial Resource Strain: Not on file  Food Insecurity: Not on file  Transportation Needs: Not on file  Physical Activity: Not on file  Stress: Not on file  Social Connections: Not on file  Intimate Partner Violence: Not on file    Review of Systems: See HPI, otherwise negative ROS  Physical Exam: BP 130/82   Pulse 74   Temp 98.2 F (36.8 C) (Oral)   Resp 13   Ht '5\' 9"'$  (1.753 m)   Wt 102.1 kg   BMI 33.23 kg/m  General:   Alert,  Well-developed, well-nourished, pleasant and cooperative in NAD Neck:  Supple; no masses or thyromegaly. No significant cervical adenopathy. Lungs:  Clear throughout to auscultation.   No wheezes, crackles, or rhonchi. No acute distress. Heart:  Regular rate and rhythm; no murmurs, clicks, rubs,  or gallops. Abdomen: Non-distended, normal bowel sounds.  Soft and nontender without appreciable mass or hepatosplenomegaly.  Pulses:  Normal pulses noted. Extremities:  Without clubbing or edema.  Impression/Plan:    61 year old gentleman here for a surveillance colonoscopy.  History of colonic adenomas removed previously.  I have offered the patient a surveillance colonoscopy today.  The risks, benefits, limitations, alternatives and imponderables have been reviewed with the patient. Questions have been answered. All parties are agreeable.       Notice: This dictation was prepared with Dragon dictation along with smaller phrase technology. Any transcriptional errors that result from this process are unintentional and may not be corrected upon review.

## 2022-08-03 NOTE — Op Note (Signed)
[facility] Patient Name: Ethan Walls Procedure Date: 08/03/2022 8:16 AM MRN: [id number] Date of Birth: 12/26/1960 Attending MD: Norvel Richards , MD, [id number] CSN: [id number] Age: 61 Admit Type: Outpatient Procedure:                Colonoscopy Indications:              High risk colon cancer surveillance: Personal                            history of colonic polyps Providers:                Norvel Richards, MD, Caprice Kluver, Aram Candela Referring MD:              Medicines:                Propofol per Anesthesia Complications:            No immediate complications. Estimated Blood Loss:     Estimated blood loss: none. Procedure:                Pre-Anesthesia Assessment:                           - Prior to the procedure, a History and Physical                            was performed, and patient medications and                            allergies were reviewed. The patient's tolerance of                            previous anesthesia was also reviewed. The risks                            and benefits of the procedure and the sedation                            options and risks were discussed with the patient.                            All questions were answered, and informed consent                            was obtained. Prior Anticoagulants: The patient has                            taken no anticoagulant or antiplatelet agents. ASA                            Grade Assessment: II - A patient with mild systemic                            disease. After reviewing the risks and benefits,  the patient was deemed in satisfactory condition to                            undergo the procedure.                           After obtaining informed consent, the colonoscope                            was passed under direct vision. Throughout the                            procedure, the patient's blood pressure, pulse, and                             oxygen saturations were monitored continuously. The                            [id number]) scope was introduced through the                            anus and advanced to the the cecum, identified by                            appendiceal orifice and ileocecal valve. The                            colonoscopy was performed without difficulty. The                            patient tolerated the procedure well. The quality                            of the bowel preparation was adequate. The                            ileocecal valve, appendiceal orifice, and rectum                            were photographed. The ileocecal valve, appendiceal                            orifice, and rectum were photographed. Scope In: 8:45:29 AM Scope Out: 8:59:14 AM Scope Withdrawal Time: 0 hours 7 minutes 42 seconds  Total Procedure Duration: 0 hours 13 minutes 45 seconds  Findings:      The perianal and digital rectal examinations were normal.      Scattered medium-mouthed diverticula were found in the sigmoid colon and       descending colon.      The exam was otherwise without abnormality on direct and retroflexion       views. Impression:               - Diverticulosis in the sigmoid colon and in the  descending colon.                           - The examination was otherwise normal on direct                            and retroflexion views.                           - No specimens collected. Moderate Sedation:      Moderate (conscious) sedation was personally administered by an       anesthesia professional. The following parameters were monitored: oxygen       saturation, heart rate, blood pressure, respiratory rate, EKG, adequacy       of pulmonary ventilation, and response to care. Recommendation:           - Patient has a contact number available for                            emergencies. The signs and symptoms of potential                             delayed complications were discussed with the                            patient. Return to normal activities tomorrow.                            Written discharge instructions were provided to the                            patient.                           - Advance diet as tolerated.                           - Continue present medications.                           - Repeat colonoscopy in 5 years for surveillance.                           - Return to GI office (date not yet determined). Procedure Code(s):        --- Professional ---                           [phone], Colonoscopy, flexible; diagnostic, including                            collection of specimen(s) by brushing or washing,                            when performed (separate procedure) Diagnosis Code(s):        --- Professional ---  Z86.010, Personal history of colonic polyps                           K57.30, Diverticulosis of large intestine without                            perforation or abscess without bleeding CPT copyright 2022 [employer] Association. All rights reserved. The codes documented in this report are preliminary and upon coder review may  be revised to meet current compliance requirements. Ethan Walls. Ethan Scheiderer, MD Norvel Richards, MD 08/03/2022 9:07:19 AM This report has been signed electronically. Number of Addenda: 0

## 2022-08-03 NOTE — Anesthesia Postprocedure Evaluation (Signed)
Anesthesia Post Note  Patient: Ethan Walls  Procedure(s) Performed: COLONOSCOPY WITH PROPOFOL  Patient location during evaluation: Phase II Anesthesia Type: General Level of consciousness: awake and alert and oriented Pain management: pain level controlled Vital Signs Assessment: post-procedure vital signs reviewed and stable Respiratory status: spontaneous breathing, nonlabored ventilation and respiratory function stable Cardiovascular status: blood pressure returned to baseline and stable Postop Assessment: no apparent nausea or vomiting Anesthetic complications: no  No notable events documented.   Last Vitals:  Vitals:   08/03/22 0901 08/03/22 0905  BP: 109/64 102/64  Pulse: 63 66  Resp: 19 (!) 22  Temp: 36.8 C   SpO2: 97% 97%    Last Pain:  Vitals:   08/03/22 0905  TempSrc:   PainSc: 0-No pain                 Toy Eisemann C Sajan Cheatwood

## 2022-08-03 NOTE — Anesthesia Preprocedure Evaluation (Addendum)
Anesthesia Evaluation  Patient identified by MRN, date of birth, ID band Patient awake    Reviewed: Allergy & Precautions, H&P , NPO status , Patient's Chart, lab work & pertinent test results  Airway Mallampati: II  TM Distance: >3 FB Neck ROM: Full    Dental  (+) Dental Advisory Given, Chipped   Pulmonary neg pulmonary ROS   Pulmonary exam normal breath sounds clear to auscultation       Cardiovascular hypertension, Pt. on medications Normal cardiovascular exam Rhythm:Regular Rate:Normal     Neuro/Psych negative neurological ROS  negative psych ROS   GI/Hepatic negative GI ROS, Neg liver ROS,,,  Endo/Other  negative endocrine ROS    Renal/GU negative Renal ROS  negative genitourinary   Musculoskeletal  (+) Arthritis  (chronic left pain), Osteoarthritis,    Abdominal   Peds negative pediatric ROS (+)  Hematology negative hematology ROS (+)   Anesthesia Other Findings Basal cell carcinoma  Reproductive/Obstetrics negative OB ROS                             Anesthesia Physical Anesthesia Plan  ASA: 2  Anesthesia Plan: General   Post-op Pain Management: Minimal or no pain anticipated   Induction: Intravenous  PONV Risk Score and Plan: TIVA  Airway Management Planned: Nasal Cannula and Natural Airway  Additional Equipment:   Intra-op Plan:   Post-operative Plan:   Informed Consent: I have reviewed the patients History and Physical, chart, labs and discussed the procedure including the risks, benefits and alternatives for the proposed anesthesia with the patient or authorized representative who has indicated his/her understanding and acceptance.       Plan Discussed with: CRNA and Surgeon  Anesthesia Plan Comments:         Anesthesia Quick Evaluation

## 2022-08-03 NOTE — Transfer of Care (Signed)
Immediate Anesthesia Transfer of Care Note  Patient: Ethan Walls  Procedure(s) Performed: COLONOSCOPY WITH PROPOFOL  Patient Location: Endoscopy Unit  Anesthesia Type:General  Level of Consciousness: drowsy  Airway & Oxygen Therapy: Patient Spontanous Breathing  Post-op Assessment: Report given to RN and Post -op Vital signs reviewed and stable  Post vital signs: Reviewed and stable  Last Vitals:  Vitals Value Taken Time  BP 109/64 08/03/22 0901  Temp 36.8 C 08/03/22 0901  Pulse 63 08/03/22 0901  Resp 19 08/03/22 0901  SpO2 97 % 08/03/22 0901    Last Pain:  Vitals:   08/03/22 0901  TempSrc: Axillary  PainSc:       Patients Stated Pain Goal: 8 (74/45/14 6047)  Complications: No notable events documented.

## 2022-08-03 NOTE — Discharge Instructions (Signed)
  Colonoscopy Discharge Instructions  Read the instructions outlined below and refer to this sheet in the next few weeks. These discharge instructions provide you with general information on caring for yourself after you leave the hospital. Your doctor may also give you specific instructions. While your treatment has been planned according to the most current medical practices available, unavoidable complications occasionally occur. If you have any problems or questions after discharge, call Dr. Gala Romney at [phone]. ACTIVITY You may resume your regular activity, but move at a slower pace for the next 24 hours.  Take frequent rest periods for the next 24 hours.  Walking will help get rid of the air and reduce the bloated feeling in your belly (abdomen).  No driving for 24 hours (because of the medicine (anesthesia) used during the test).   Do not sign any important legal documents or operate any machinery for 24 hours (because of the anesthesia used during the test).  NUTRITION Drink plenty of fluids.  You may resume your normal diet as instructed by your doctor.  Begin with a light meal and progress to your normal diet. Heavy or fried foods are harder to digest and may make you feel sick to your stomach (nauseated).  Avoid alcoholic beverages for 24 hours or as instructed.  MEDICATIONS You may resume your normal medications unless your doctor tells you otherwise.  WHAT YOU CAN EXPECT TODAY Some feelings of bloating in the abdomen.  Passage of more gas than usual.  Spotting of blood in your stool or on the toilet paper.  IF YOU HAD POLYPS REMOVED DURING THE COLONOSCOPY: No aspirin products for 7 days or as instructed.  No alcohol for 7 days or as instructed.  Eat a soft diet for the next 24 hours.  FINDING OUT THE RESULTS OF YOUR TEST Not all test results are available during your visit. If your test results are not back during the visit, make an appointment with your caregiver to find out the  results. Do not assume everything is normal if you have not heard from your caregiver or the medical facility. It is important for you to follow up on all of your test results.  SEEK IMMEDIATE MEDICAL ATTENTION IF: You have more than a spotting of blood in your stool.  Your belly is swollen (abdominal distention).  You are nauseated or vomiting.  You have a temperature over 101.  You have abdominal pain or discomfort that is severe or gets worse throughout the day.      information on diverticulosis provided  No polyps found today  recommend repeat colonoscopy in 5 years   at patient request, called Joaquim Lai at [phone] -  discussed

## 2022-08-10 ENCOUNTER — Encounter (HOSPITAL_COMMUNITY): Payer: Self-pay | Admitting: Internal Medicine

## 2022-08-26 ENCOUNTER — Other Ambulatory Visit: Payer: Self-pay | Admitting: Family Medicine

## 2022-08-31 ENCOUNTER — Other Ambulatory Visit: Payer: Self-pay | Admitting: Family Medicine

## 2022-08-31 DIAGNOSIS — I10 Essential (primary) hypertension: Secondary | ICD-10-CM

## 2022-08-31 MED ORDER — ENALAPRIL MALEATE 20 MG PO TABS: 20.0000 mg | ORAL_TABLET | Freq: Two times a day (BID) | ORAL | 3 refills | Status: DC

## 2022-08-31 MED ORDER — ROSUVASTATIN CALCIUM 10 MG PO TABS: 10.0000 mg | ORAL_TABLET | Freq: Every day | ORAL | 3 refills | Status: DC

## 2022-08-31 MED ORDER — AMLODIPINE BESYLATE 10 MG PO TABS: 10.0000 mg | ORAL_TABLET | Freq: Every day | ORAL | 3 refills | Status: DC

## 2022-09-02 ENCOUNTER — Other Ambulatory Visit: Payer: Self-pay | Admitting: *Deleted

## 2022-09-02 MED ORDER — AMLODIPINE BESYLATE 10 MG PO TABS: 10.0000 mg | ORAL_TABLET | Freq: Every day | ORAL | 1 refills | Status: DC

## 2023-01-12 ENCOUNTER — Emergency Department (HOSPITAL_COMMUNITY): Payer: BC Managed Care – PPO

## 2023-01-12 ENCOUNTER — Emergency Department (HOSPITAL_COMMUNITY)
Admission: EM | Admit: 2023-01-12 | Discharge: 2023-01-12 | Disposition: A | Discharge status: Home / Self Care | Payer: BC Managed Care – PPO | Attending: Emergency Medicine | Admitting: Emergency Medicine

## 2023-01-12 ENCOUNTER — Other Ambulatory Visit: Payer: Self-pay

## 2023-01-12 ENCOUNTER — Encounter (HOSPITAL_COMMUNITY): Payer: Self-pay

## 2023-01-12 DIAGNOSIS — J189 Pneumonia, unspecified organism: Secondary | ICD-10-CM

## 2023-01-12 DIAGNOSIS — Z1152 Encounter for screening for COVID-19: Secondary | ICD-10-CM | POA: Diagnosis not present

## 2023-01-12 DIAGNOSIS — Z79899 Other long term (current) drug therapy: Secondary | ICD-10-CM | POA: Insufficient documentation

## 2023-01-12 DIAGNOSIS — I10 Essential (primary) hypertension: Secondary | ICD-10-CM | POA: Insufficient documentation

## 2023-01-12 DIAGNOSIS — J181 Lobar pneumonia, unspecified organism: Secondary | ICD-10-CM | POA: Diagnosis not present

## 2023-01-12 DIAGNOSIS — R059 Cough, unspecified: Secondary | ICD-10-CM | POA: Diagnosis present

## 2023-01-12 LAB — CBC WITH DIFFERENTIAL/PLATELET
Abs Immature Granulocytes: 0.07 10*3/uL (ref 0.00–0.07)
Basophils Absolute: 0.1 10*3/uL (ref 0.0–0.1)
Basophils Relative: 0 %
Eosinophils Absolute: 0 10*3/uL (ref 0.0–0.5)
Eosinophils Relative: 0 %
HCT: 40.9 % (ref 39.0–52.0)
Hemoglobin: 13.7 g/dL (ref 13.0–17.0)
Immature Granulocytes: 0 %
Lymphocytes Relative: 4 %
Lymphs Abs: 0.6 10*3/uL — ABNORMAL LOW (ref 0.7–4.0)
MCH: 29.3 pg (ref 26.0–34.0)
MCHC: 33.5 g/dL (ref 30.0–36.0)
MCV: 87.6 fL (ref 80.0–100.0)
Monocytes Absolute: 1.3 10*3/uL — ABNORMAL HIGH (ref 0.1–1.0)
Monocytes Relative: 8 %
Neutro Abs: 14.2 10*3/uL — ABNORMAL HIGH (ref 1.7–7.7)
Neutrophils Relative %: 88 %
Platelets: 230 10*3/uL (ref 150–400)
RBC: 4.67 MIL/uL (ref 4.22–5.81)
RDW: 13.2 % (ref 11.5–15.5)
WBC: 16.2 10*3/uL — ABNORMAL HIGH (ref 4.0–10.5)
nRBC: 0 % (ref 0.0–0.2)

## 2023-01-12 LAB — RESP PANEL BY RT-PCR (RSV, FLU A&B, COVID)  RVPGX2
Influenza A by PCR: NEGATIVE
Influenza B by PCR: NEGATIVE
Resp Syncytial Virus by PCR: NEGATIVE
SARS Coronavirus 2 by RT PCR: NEGATIVE

## 2023-01-12 LAB — BASIC METABOLIC PANEL
Anion gap: 6 (ref 5–15)
BUN: 30 mg/dL — ABNORMAL HIGH (ref 8–23)
CO2: 24 mmol/L (ref 22–32)
Calcium: 9.7 mg/dL (ref 8.9–10.3)
Chloride: 104 mmol/L (ref 98–111)
Creatinine, Ser: 1.42 mg/dL — ABNORMAL HIGH (ref 0.61–1.24)
GFR, Estimated: 55 mL/min — ABNORMAL LOW (ref >60)
Glucose, Bld: 166 mg/dL — ABNORMAL HIGH (ref 70–99)
Potassium: 4.4 mmol/L (ref 3.5–5.1)
Sodium: 134 mmol/L — ABNORMAL LOW (ref 135–145)

## 2023-01-12 MED ORDER — AZITHROMYCIN 250 MG PO TABS: 250.0000 mg | ORAL_TABLET | Freq: Every day | ORAL | 0 refills | Status: AC

## 2023-01-12 MED ORDER — AZITHROMYCIN 250 MG PO TABS: 250.0000 mg | ORAL_TABLET | Freq: Once | ORAL | Status: DC

## 2023-01-12 MED ORDER — CHLORPROMAZINE HCL 25 MG/ML IJ SOLN
25.0000 mg | Freq: Once | INTRAMUSCULAR | Status: AC
  Administered 2023-01-12: 25 mg via INTRAMUSCULAR
  Filled 2023-01-12: qty 1

## 2023-01-12 MED ORDER — SODIUM CHLORIDE 0.9 % IV BOLUS
1000.0000 mL | Freq: Once | INTRAVENOUS | Status: AC
  Administered 2023-01-12: 1000 mL via INTRAVENOUS

## 2023-01-12 MED ORDER — AMOXICILLIN-POT CLAVULANATE 875-125 MG PO TABS: 1.0000 | ORAL_TABLET | Freq: Two times a day (BID) | ORAL | 0 refills | Status: AC

## 2023-01-12 MED ORDER — AZITHROMYCIN 250 MG PO TABS
500.0000 mg | ORAL_TABLET | Freq: Once | ORAL | Status: AC
  Administered 2023-01-12: 500 mg via ORAL
  Filled 2023-01-12: qty 2

## 2023-01-12 MED ORDER — AMOXICILLIN-POT CLAVULANATE 875-125 MG PO TABS
1.0000 | ORAL_TABLET | Freq: Once | ORAL | Status: AC
  Administered 2023-01-12: 1 via ORAL
  Filled 2023-01-12: qty 1

## 2023-01-12 NOTE — ED Notes (Signed)
Went in to discharge pt and pt found to have hiccups; pt requesting something to be given; EDP made aware and order for medication given

## 2023-01-12 NOTE — ED Provider Notes (Addendum)
[place] EMERGENCY DEPARTMENT AT [facility] Provider Note   CSN: [id number] Arrival date & time: 01/12/23  0545     History  No chief complaint on file.   Ethan Walls is a 62 y.o. male with past medical history of HLD and HTN who presents to the ED complaining of productive cough and congestion x3 days. Patient returned from vacation on Wednesday, and endorses sore throat on Thursday which eventually turned to cough and congestion by friday. Patient also complains of hiccups that is not relieved with chloropromazine, but is without hiccups currently.  Patient states that his phlegm was initially green/yellow, but has recently turned black. Patient endorses COVID and annual flu vaccine. Denies tobacco use. Denies fever, chills, headache, sinus pain, neck stiffness, nausea, vomiting, diarrhea.  HPI     Home Medications Prior to Admission medications   Medication Sig Start Date End Date Taking? Authorizing Provider  amLODipine (NORVASC) 10 MG tablet Take 1 tablet (10 mg total) by mouth daily. 09/02/22  Yes Cook, Jayce G, DO  amoxicillin-clavulanate (AUGMENTIN) 875-125 MG tablet Take 1 tablet by mouth every 12 (twelve) hours for 7 days. 01/12/23 01/19/23 Yes Valrie Hart F, PA-C  azithromycin (ZITHROMAX) 250 MG tablet Take 1 tablet (250 mg total) by mouth daily for 4 days. Take first 2 tablets together, then 1 every day until finished. 01/12/23 01/16/23 Yes Valrie Hart F, PA-C  cholecalciferol (VITAMIN D3) 25 MCG (1000 UNIT) tablet Take 1,000 Units by mouth daily.   Yes [provider]  enalapril (VASOTEC) 20 MG tablet Take 1 tablet (20 mg total) by mouth 2 (two) times daily. 08/31/22  Yes Cook, Jayce G, DO  meloxicam (MOBIC) 15 MG tablet TAKE 1 TABLET BY MOUTH EVERY DAY AS NEEDED FOR PAIN Patient taking differently: Take 15 mg by mouth daily as needed for pain. 08/26/22  Yes Cook, Jayce G, DO  rosuvastatin (CRESTOR) 10 MG tablet Take 1 tablet (10 mg total)  by mouth daily. 08/31/22  Yes Tommie Sams, DO      Allergies    Patient has no known allergies.    Review of Systems   Review of Systems  HENT:  Positive for congestion.   Respiratory:  Positive for cough.     Physical Exam Updated Vital Signs BP (!) 159/75   Pulse 98   Temp 98.4 F (36.9 C)   Resp 17   Wt 102.1 kg   SpO2 99%   BMI 33.23 kg/m  Physical Exam Vitals and nursing note reviewed.  Constitutional:      General: He is not in acute distress.    Appearance: Normal appearance. He is not ill-appearing or toxic-appearing.  HENT:     Head: Normocephalic and atraumatic.     Mouth/Throat:     Mouth: Mucous membranes are moist.     Pharynx: Oropharynx is clear. No oropharyngeal exudate or posterior oropharyngeal erythema.  Eyes:     General: No scleral icterus.       Right eye: No discharge.        Left eye: No discharge.     Conjunctiva/sclera: Conjunctivae normal.  Cardiovascular:     Rate and Rhythm: Normal rate.     Pulses: Normal pulses.     Heart sounds: No murmur heard. Pulmonary:     Effort: Pulmonary effort is normal. No respiratory distress.     Breath sounds: Normal breath sounds. No wheezing, rhonchi or rales.  Abdominal:     General: Abdomen is  flat. Bowel sounds are normal.     Palpations: Abdomen is soft.     Tenderness: There is no abdominal tenderness.  Musculoskeletal:     Cervical back: Normal range of motion and neck supple. No tenderness.     Right lower leg: No edema.     Left lower leg: No edema.  Lymphadenopathy:     Cervical: No cervical adenopathy.  Skin:    General: Skin is warm and dry.     Findings: No rash.  Neurological:     General: No focal deficit present.     Mental Status: He is alert. Mental status is at baseline.  Psychiatric:        Mood and Affect: Mood normal.        Behavior: Behavior normal.     ED Results / Procedures / Treatments   Labs (all labs ordered are listed, but only abnormal results are  displayed) Labs Reviewed  BASIC METABOLIC PANEL - Abnormal; Notable for the following components:      Result Value   Sodium 134 (*)    Glucose, Bld 166 (*)    BUN 30 (*)    Creatinine, Ser 1.42 (*)    GFR, Estimated 55 (*)    All other components within normal limits  CBC WITH DIFFERENTIAL/PLATELET - Abnormal; Notable for the following components:   WBC 16.2 (*)    Neutro Abs 14.2 (*)    Lymphs Abs 0.6 (*)    Monocytes Absolute 1.3 (*)    All other components within normal limits  RESP PANEL BY RT-PCR (RSV, FLU A&B, COVID)  [id number]    EKG None  Radiology DG Chest 1 View  Result Date: 01/12/2023 CLINICAL DATA:  62 year old male with history of coughing congestion for the past several days. EXAM: CHEST  1 VIEW COMPARISON:  Chest x-ray 01/25/2014. FINDINGS: New nodular density projecting over the base of the left lung. Right lung is clear. No pleural effusions. No pneumothorax. No evidence of pulmonary edema. Heart size is normal. Upper mediastinal contours are within normal limits. IMPRESSION: 1. New nodular density projecting over the lateral aspect of the left lung base. This could be infectious or inflammatory in etiology, however, underlying neoplasm is not excluded. Followup PA and lateral chest X-ray is recommended in 3-4 weeks following trial of antibiotic therapy to ensure resolution and exclude underlying malignancy. Electronically Signed   By: Trudie Reed M.D.   On: 01/12/2023 07:48    Procedures Procedures    Medications Ordered in ED Medications  amoxicillin-clavulanate (AUGMENTIN) 875-125 MG per tablet 1 tablet (has no administration in time range)  azithromycin (ZITHROMAX) tablet 500 mg (has no administration in time range)  sodium chloride 0.9 % bolus 1,000 mL (1,000 mLs Intravenous New Bag/Given 01/12/23 0835)    ED Course/ Medical Decision Making/ A&P Clinical Course as of 01/12/23 0956  Tue Jan 12, 2023  0746 DG Chest 1 View [SM]    Clinical Course User  Index [SM] Dorthy Cooler, [place]                             Medical Decision Making Amount and/or Complexity of Data Reviewed Labs: ordered. Radiology: ordered. Decision-making details documented in ED Course.  Risk Prescription drug management.    This patient presents to the ED for concern of cough and congestion, this involves an extensive number of treatment options, and is a complaint that carries with it a  high risk of complications and morbidity.  The differential diagnosis includes Flu/COVID/RSV, strep pharyngitis, tuberculosis, sinusitis, peritonsillar abscess, retropharyngeal abscess, pneumonia, meningitis.   Co morbidities that complicate the patient evaluation  none   Additional history obtained:  none   Lab Tests:  I Ordered, and personally interpreted labs.  The pertinent results include:   CBC: without concern of anemia. WBC 16.2 BMP: BUN/Cr (30/1.42) Respiratory Panel: negative   Imaging Studies ordered:  I ordered imaging studies including Chest Xray I independently visualized and interpreted imaging which showed: New nodular density projecting over the lateral aspect of the left lung base. This could be infectious or inflammatory in etiology, however, underlying neoplasm is not excluded.  I agree with the radiologist interpretation   Problem List / ED Course / Critical interventions / Medication management  7:35AM Patient presenting to ED complaining of productive cough and congestion. Patient is afebrile and vital signs are stable. Ordering respiratory panel and xray to assess for PNA vs URI vs bronchitis.  8:10AM Chest xray showed nodular density over left lower lung. WBC count elevated at 16.2. Overall, patient is not ill appearing, not in acute distress, and breathing well on room air. I shared with patient that I do not think he needs inpatient treatment at this time, and he agreed with me stating that he would rather be discharged home  with antibiotic treatment.  Of note, patient's BMP with concerns elevated BUN/Cr (30/1.42). Patient stated that he has not been drinking much fluid at all, and has has past problems with dehydration due to lack of fluid intake. Patient receiving 1L of fluids and I will reassess patient before discharge. Patient reported black stools during initial interview. I educated patient that this could be due to medicines that he is taking for his current symptoms, but it is important to follow up with PCP and obtain endoscopy. Currently denying symptoms of anemia and CBC without concern. I have requested patient to follow up with endoscopy. Patient verbally agreed to plan.  9:40AM Patient given first dose of antibiotics in ED. Prescription sent to pharmacy for rest of ABX course. Patient reports that he is feeling ready for discharge. Patient was educated on alternating between 650 mg Tylenol and 400 mg ibuprofen every 3 hours as needed for pain and the benefit that honey may provide for cough symptoms. Patient was given return precautions and is stable for discharge at this time. Patient verbalized understanding of plan.  10:39 AM Upon discharge, patient complaining of new onset hiccups. He is concerned because his hiccups last for days and he usually needs "a shot" to relieve symptoms. I have prescribed Thorazine IM at this time. Patient still stable for discharge.   DDx: These are considered less likely due to history of present illness and physical exam findings -Peritonsillar/Retropharyngeal abscess: no throat/oral swelling appreciated -Meningitis: patient's symptoms, vital signs, physical exam findings including lack of meningismus seem grossly less consistent at this time -Flu/COVID/RSV: Respiratory panel negative -Strep pharyngitis: Patient denies dysphagia and no tonsillar swelling/exudates appreciated -Tuberculosis: Patient denies night sweats, weight loss, fever, immunocompromise -Sinusitis:  Patient denies facial pain   Social Determinants of Health:  none          Final Clinical Impression(s) / ED Diagnoses Final diagnoses:  Pneumonia of left lower lobe due to infectious organism    Rx / DC Orders ED Discharge Orders          Ordered    amoxicillin-clavulanate (AUGMENTIN) 875-125 MG tablet  Every 12  hours        01/12/23 0918    azithromycin (ZITHROMAX) 250 MG tablet  Daily        01/12/23 0918              Dorthy Cooler, PA-C 01/12/23 0957    Dorthy Cooler, PA-C 01/12/23 1040    Gerhard Munch, MD 01/12/23 [phone]

## 2023-01-12 NOTE — Discharge Instructions (Addendum)
It was a pleasure taking care of you today. Blood labs showed no concern for anemia. Please make sure to drink plenty of fluids and take antibiotics as prescribed. Follow up PA and lateral chest X-ray is recommended in 3-4 weeks following trial of antibiotic therapy to ensure resolution and exclude underlying malignancy. Please also follow up with primary care for endoscopy.

## 2023-01-12 NOTE — ED Triage Notes (Signed)
Pt presents with cough and congestion x couple days. States that when he coughed today his phlegm was black. Also states that stool was black today. Rates abdominal pain 2 on pain scale.

## 2023-01-13 ENCOUNTER — Telehealth: Payer: Self-pay | Admitting: *Deleted

## 2023-01-13 ENCOUNTER — Ambulatory Visit: Payer: BC Managed Care – PPO | Admitting: Family Medicine

## 2023-01-13 ENCOUNTER — Other Ambulatory Visit (HOSPITAL_COMMUNITY)
Admission: RE | Admit: 2023-01-13 | Discharge: 2023-01-13 | Disposition: A | Discharge status: Home / Self Care | Payer: BC Managed Care – PPO | Source: Ambulatory Visit | Attending: Family Medicine | Admitting: Family Medicine

## 2023-01-13 ENCOUNTER — Telehealth: Payer: Self-pay | Admitting: Gastroenterology

## 2023-01-13 VITALS — BP 99/66 | HR 92 | Temp 98.7°F | Ht 69.0 in | Wt 227.0 lb

## 2023-01-13 DIAGNOSIS — R066 Hiccough: Secondary | ICD-10-CM | POA: Insufficient documentation

## 2023-01-13 DIAGNOSIS — R7989 Other specified abnormal findings of blood chemistry: Secondary | ICD-10-CM | POA: Diagnosis not present

## 2023-01-13 DIAGNOSIS — K921 Melena: Secondary | ICD-10-CM | POA: Diagnosis present

## 2023-01-13 LAB — COMPREHENSIVE METABOLIC PANEL
ALT: 27 U/L (ref 0–44)
AST: 24 U/L (ref 15–41)
Albumin: 3.7 g/dL (ref 3.5–5.0)
Alkaline Phosphatase: 48 U/L (ref 38–126)
Anion gap: 8 (ref 5–15)
BUN: 18 mg/dL (ref 8–23)
CO2: 25 mmol/L (ref 22–32)
Calcium: 9.1 mg/dL (ref 8.9–10.3)
Chloride: 102 mmol/L (ref 98–111)
Creatinine, Ser: 1.16 mg/dL (ref 0.61–1.24)
GFR, Estimated: >60 mL/min (ref >60)
Glucose, Bld: 118 mg/dL — ABNORMAL HIGH (ref 70–99)
Potassium: 3.8 mmol/L (ref 3.5–5.1)
Sodium: 135 mmol/L (ref 135–145)
Total Bilirubin: 0.7 mg/dL (ref 0.3–1.2)
Total Protein: 7.1 g/dL (ref 6.5–8.1)

## 2023-01-13 LAB — CBC WITH DIFFERENTIAL/PLATELET
Abs Immature Granulocytes: 0.05 10*3/uL (ref 0.00–0.07)
Basophils Absolute: 0.1 10*3/uL (ref 0.0–0.1)
Basophils Relative: 1 %
Eosinophils Absolute: 0.3 10*3/uL (ref 0.0–0.5)
Eosinophils Relative: 2 %
HCT: 37.6 % — ABNORMAL LOW (ref 39.0–52.0)
Hemoglobin: 12.8 g/dL — ABNORMAL LOW (ref 13.0–17.0)
Immature Granulocytes: 0 %
Lymphocytes Relative: 9 %
Lymphs Abs: 1.2 10*3/uL (ref 0.7–4.0)
MCH: 29.9 pg (ref 26.0–34.0)
MCHC: 34 g/dL (ref 30.0–36.0)
MCV: 87.9 fL (ref 80.0–100.0)
Monocytes Absolute: 1.3 10*3/uL — ABNORMAL HIGH (ref 0.1–1.0)
Monocytes Relative: 10 %
Neutro Abs: 10.2 10*3/uL — ABNORMAL HIGH (ref 1.7–7.7)
Neutrophils Relative %: 78 %
Platelets: 221 10*3/uL (ref 150–400)
RBC: 4.28 MIL/uL (ref 4.22–5.81)
RDW: 13.4 % (ref 11.5–15.5)
WBC: 13.1 10*3/uL — ABNORMAL HIGH (ref 4.0–10.5)
nRBC: 0 % (ref 0.0–0.2)

## 2023-01-13 LAB — HEMOCCULT GUIAC POC 1CARD (OFFICE): Fecal Occult Blood, POC: POSITIVE — AB

## 2023-01-13 LAB — TROPONIN I (HIGH SENSITIVITY): Troponin I (High Sensitivity): 14 ng/L (ref <18)

## 2023-01-13 MED ORDER — SUCRALFATE 1 G PO TABS: 1.0000 g | ORAL_TABLET | Freq: Four times a day (QID) | ORAL | 1 refills | Status: DC

## 2023-01-13 MED ORDER — PANTOPRAZOLE SODIUM 40 MG PO TBEC
40.0000 mg | DELAYED_RELEASE_TABLET | Freq: Two times a day (BID) | ORAL | 3 refills | Status: DC
Start: 2023-01-13 — End: 2023-02-11

## 2023-01-13 NOTE — Telephone Encounter (Signed)
Patient was seen in ER yesterday for dark black stools and blood in stool- Patient was dx with pneumonia and advised he needed evaluation with Gi and endoscopy/colonoscopy. Patient has appt with GI on Tuesday 01/19/23 but is still having black/blood stools and they are concerned about waiting. Please advise

## 2023-01-13 NOTE — Patient Instructions (Addendum)
I will call with lab results.   No NSAIDs.  Dr. Adriana Simas

## 2023-01-13 NOTE — Progress Notes (Signed)
Subjective:  Patient ID: Ethan Walls, male    DOB: 10/14/1960  Age: 62 y.o. MRN: [id number]  CC: Chief Complaint  Patient presents with   black and blood stool    X 2 days went to Er yesterday   Hiccups    X 5 days received thorazine shot in ER yesterday    HPI:  62 year old male presents for evaluation the above.  Patient recent developed respiratory symptoms and subsequently went to the hospital yesterday.  Chest x-ray was concerning for pneumonia and he was started on antibiotic therapy.  Labs at that time revealed leukocytosis and mild AKI.  He was given IV fluids as well.  Additionally, patient informed the ER that he was having dark stools.  Hemoglobin was normal.  He was discharged home with instructions to follow-up with me and also to see a GI provider.  Patient reports that he has continued to have some black stool.  He is also had some hematochezia.  Denies abdominal pain.  No significant NSAID use.  He used meloxicam intermittently before for pain.  He has not been taking NSAIDs on a regular basis.  Patient is taking antibiotic as prescribed.  Patient also notes that he has had ongoing hiccups for the past 5 days.  This continues to persist.  He was given Thorazine in the hospital for hiccups.  No documented fever.  No reports of nausea or vomiting.  Patient Active Problem List   Diagnosis Date Noted   Melena 01/13/2023   Intractable hiccups 01/13/2023   Chronic pain of left knee 07/30/2022   Healthcare maintenance 01/21/2022   BPH (benign prostatic hyperplasia) 07/10/2021   History of colonic polyps    Essential hypertension, benign 02/18/2014   Hyperlipidemia 02/18/2014    Social Hx   Social History   Socioeconomic History   Marital status: Married    Spouse name: Not on file   Number of children: Not on file   Years of education: Not on file   Highest education level: Not on file  Occupational History   Not on file  Tobacco Use   Smoking status: Never    Smokeless tobacco: Never  Vaping Use   Vaping Use: Never used  Substance and Sexual Activity   Alcohol use: No   Drug use: No   Sexual activity: Not on file  Other Topics Concern   Not on file  Social History Narrative   Not on file   Social Determinants of Health   Financial Resource Strain: Not on file  Food Insecurity: Not on file  Transportation Needs: Not on file  Physical Activity: Not on file  Stress: Not on file  Social Connections: Not on file    Review of Systems Per HPI  Objective:  BP 99/66   Pulse 92   Temp 98.7 F (37.1 C)   Ht  (1.753 m)   Wt 227 lb (103 kg)   SpO2 97%   BMI 33.52 kg/m      01/13/2023    9:24 AM 01/12/2023   10:00 AM 01/12/2023    9:30 AM  BP/Weight  Systolic BP 99 145 149  Diastolic BP 66 74 68  Wt. (Lbs) 227    BMI 33.52 kg/m2      Physical Exam  Lab Results  Component Value Date   WBC 13.1 (H) 01/13/2023   HGB 12.8 (L) 01/13/2023   HCT 37.6 (L) 01/13/2023   PLT 221 01/13/2023   GLUCOSE 118 (H)  01/13/2023   CHOL 147 07/29/2022   TRIG 97 07/29/2022   HDL 54 07/29/2022   LDLCALC 75 07/29/2022   ALT 27 01/13/2023   AST 24 01/13/2023   NA 135 01/13/2023   K 3.8 01/13/2023   CL 102 01/13/2023   CREATININE 1.16 01/13/2023   BUN 18 01/13/2023   CO2 25 01/13/2023   PSA 0.21 02/15/2014     Assessment & Plan:   Problem List Items Addressed This Visit       Digestive   Melena - Primary    Hemoccult positive. Hemoglobin 12.8 today.  Down from 13.7.  Planning on repeating in the next 48 hours. Started on Protonix and Carafate.  Reaching out to GI.  Needs endoscopy. Patient and his wife were informed of the need to go to the hospital if he has persistent and worsening bleeding or other worrisome symptoms.      Relevant Orders   CBC with Differential   Hemoccult - 1 Card (office) (Completed)     Other   Intractable hiccups    Suspect this is related to ongoing GI issue.  EKG with ST depression in V6.   Otherwise unremarkable.  Troponin negative. I am hopeful this is going to improve with PPI.      Relevant Orders   EKG 12-Lead (Completed)   Troponin I   Other Visit Diagnoses     Elevated serum creatinine       Relevant Orders   Comprehensive metabolic panel       Meds ordered this encounter  Medications   pantoprazole (PROTONIX) 40 MG tablet    Sig: Take 1 tablet (40 mg total) by mouth 2 (two) times daily.    Dispense:  60 tablet    Refill:  3   sucralfate (CARAFATE) 1 g tablet    Sig: Take 1 tablet (1 g total) by mouth 4 (four) times daily.    Dispense:  120 tablet    Refill:  1    Follow-up: Pending appointment with GI.  Everlene Other DO [facility]

## 2023-01-13 NOTE — Assessment & Plan Note (Addendum)
Hemoccult positive. Hemoglobin 12.8 today.  Down from 13.7.  Planning on repeating in the next 48 hours. Started on Protonix and Carafate.  Reaching out to GI.  Needs endoscopy. Patient and his wife were informed of the need to go to the hospital if he has persistent and worsening bleeding or other worrisome symptoms.

## 2023-01-13 NOTE — Telephone Encounter (Signed)
Dr. Adriana Simas reached out and requested urgent ov for melena, stable hgb. Please contact patient and get in for appt for tomorrow.

## 2023-01-13 NOTE — Assessment & Plan Note (Addendum)
Suspect this is related to ongoing GI issue.  EKG with ST depression in V6.  Otherwise unremarkable.  Troponin negative. I am hopeful this is going to improve with PPI.

## 2023-01-14 ENCOUNTER — Ambulatory Visit (INDEPENDENT_AMBULATORY_CARE_PROVIDER_SITE_OTHER): Payer: BC Managed Care – PPO | Admitting: Gastroenterology

## 2023-01-14 VITALS — BP 125/80 | HR 85 | Temp 98.8°F | Ht 69.0 in | Wt 227.0 lb

## 2023-01-14 DIAGNOSIS — K922 Gastrointestinal hemorrhage, unspecified: Secondary | ICD-10-CM | POA: Insufficient documentation

## 2023-01-14 NOTE — H&P (View-Only) (Signed)
     Gastroenterology Office Note     Primary Care Physician:  Cook, Jayce G, DO  Primary Gastroenterologist: Dr. Rourk    Chief Complaint   Chief Complaint  Patient presents with   Melena    Dr. Cook wanted him to be seen due to melena     History of Present Illness   Ethan Walls is a 62 y.o. male presenting today at the request of Dr. Cook to be seen urgently in light of new onset melena. Colonoscopy up-to-date as of Nov 2023 with diverticulosis.   Tuesday around 3-4am new onset black stool. Sooty black. Still having black stool. Last stool about 130pm today with 2 tbsp of black stool. Wednesday morning had red stool on tissue paper. Could see blood ring around black stool. Feels good. Denies fatigue. Denies dysphagia. Started pantoprazole yesterday. Has had hiccups since Saturday, even with sleeping. Hiccups intermittently.    Had to cough up a bunch of phlegm that was black and yellow. No fever or chills. Seldom any NSAIDs. Maybe once or twice a year. On antibiotics for pneumonia. No SOB. No DOE. Remote EGD about 40 years ago.    Hgb 13.7 two days ago, yesterday 12.8. Heme positive. CBC again tomorrow through PCP.   Past Medical History:  Diagnosis Date   BCC (basal cell carcinoma of skin) 03/03/2012   upper right shoulder tx with bx   Hiccups    Hypertension    Nodular basal cell carcinoma (BCC) 04/10/2015   Right Jawline (tx p bx)   Pigmented basal cell carcinoma (BCC) 07/06/2012   right clavicale tx with bx   SCCA (squamous cell carcinoma) of skin 07/05/2018   Left Forearm, Mid (in situ) (curet and 5FU)   SCCA (squamous cell carcinoma) of skin 07/05/2018   Right Front Scalp (in situ) (curet and 5FU)   SCCA (squamous cell carcinoma) of skin 01/27/2022   Right Supraclavicular Area(CX3)   Squamous cell carcinoma of skin 11/21/2008   Right Temple (well diff) (curet and 5FU)   Superficial basal cell carcinoma (BCC) 10/29/2008   right post shoulder cx3 5fu    Superficial basal cell carcinoma (BCC) 10/29/2008   left ant shoulder tx with bx   Superficial basal cell carcinoma (BCC) 10/29/2008   left side of the neck cx3 5fu   Superficial basal cell carcinoma (BCC) 01/07/2009   upper back tx with bx   Superficial basal cell carcinoma (BCC) 03/03/2012   right shoulder below previus bx tx with bx   Superficial basal cell carcinoma (BCC) 07/06/2012   left shoulder outer tx with bx   Superficial basal cell carcinoma (BCC) 10/31/2012   Left Shouder Outer (tx p bx)   Superficial basal cell carcinoma (BCC) 11/21/2014   Left Neck (curet and 5FU)   Superficial basal cell carcinoma (BCC) 07/05/2018   Right Neck Inferior (curet and 5FU)   Superficial basal cell carcinoma (BCC) 01/27/2022   Right Shoulder - posterior(CX3)   Superficial basal cell carcinoma (BCC) 01/27/2022   Right Posterior Mandible(CX3)   Superficial nodular basal cell carcinoma (BCC) 07/05/2018   Left Mid Back, Lower (curet and 5FU)   Superficial nodular basal cell carcinoma (BCC) 07/05/2018   Right Sideburn (curet and 5FU)   Superficial nodular basal cell carcinoma (BCC) 01/27/2022   Right Deltoid Inferior(CX3)    Past Surgical History:  Procedure Laterality Date   COLONOSCOPY N/A 12/19/2015   Procedure: COLONOSCOPY;  Surgeon: Robert M Rourk, MD;  [facility];    Service: Endoscopy;  Laterality: N/A;  2:00 PM -  moved to 2:15 - office to notify   COLONOSCOPY WITH PROPOFOL N/A 08/03/2022   Procedure: COLONOSCOPY WITH PROPOFOL;  Surgeon: Rourk, Robert M, MD;  [facility];  Service: Endoscopy;  Laterality: N/A;  8:30am, asa 2   Left index finger      Current Outpatient Medications  Medication Sig Dispense Refill   amLODipine (NORVASC) 10 MG tablet Take 1 tablet (10 mg total) by mouth daily. 90 tablet 1   amoxicillin-clavulanate (AUGMENTIN) 875-125 MG tablet Take 1 tablet by mouth every 12 (twelve) hours for 7 days. 13 tablet 0   azithromycin (ZITHROMAX) 250  MG tablet Take 1 tablet (250 mg total) by mouth daily for 4 days. Take first 2 tablets together, then 1 every day until finished. 4 tablet 0   cholecalciferol (VITAMIN D3) 25 MCG (1000 UNIT) tablet Take 1,000 Units by mouth daily.     enalapril (VASOTEC) 20 MG tablet Take 1 tablet (20 mg total) by mouth 2 (two) times daily. 180 tablet 3   pantoprazole (PROTONIX) 40 MG tablet Take 1 tablet (40 mg total) by mouth 2 (two) times daily. 60 tablet 3   rosuvastatin (CRESTOR) 10 MG tablet Take 1 tablet (10 mg total) by mouth daily. 90 tablet 3   sucralfate (CARAFATE) 1 g tablet Take 1 tablet (1 g total) by mouth 4 (four) times daily. 120 tablet 1   No current facility-administered medications for this visit.    Allergies as of 01/14/2023   (No Known Allergies)    Family History  Problem Relation Age of Onset   Colon cancer Neg Hx     Social History   Socioeconomic History   Marital status: Married    Spouse name: Not on file   Number of children: Not on file   Years of education: Not on file   Highest education level: 12th grade  Occupational History   Not on file  Tobacco Use   Smoking status: Never   Smokeless tobacco: Never  Vaping Use   Vaping Use: Never used  Substance and Sexual Activity   Alcohol use: No   Drug use: No   Sexual activity: Yes  Other Topics Concern   Not on file  Social History Narrative   Not on file   Social Determinants of Health   Financial Resource Strain: Low Risk  (01/14/2023)   Overall Financial Resource Strain (CARDIA)    Difficulty of Paying Living Expenses: Not hard at all  Food Insecurity: No Food Insecurity (01/14/2023)   Hunger Vital Sign    Worried About Running Out of Food in the Last Year: Never true    Ran Out of Food in the Last Year: Never true  Transportation Needs: No Transportation Needs (01/14/2023)   PRAPARE - Transportation    Lack of Transportation (Medical): No    Lack of Transportation (Non-Medical): No  Physical  Activity: Not on file  Stress: No Stress Concern Present (01/14/2023)   [employer] - Occupational Stress Questionnaire    Feeling of Stress : Not at all  Social Connections: Unknown (01/14/2023)   Social Connection and Isolation Panel [NHANES]    Frequency of Communication with Friends and Family: More than three times a week    Frequency of Social Gatherings with Friends and Family: Once a week    Attends Religious Services: Patient declined    Active Member of Clubs or Organizations: No      Attends Club or Organization Meetings: Not on file    Marital Status: Married  Intimate Partner Violence: Not on file     Review of Systems   Gen: Denies any fever, chills, fatigue, weight loss, lack of appetite.  CV: Denies chest pain, heart palpitations, peripheral edema, syncope.  Resp: Denies shortness of breath at rest or with exertion. Denies wheezing or cough.  GI: Denies dysphagia or odynophagia. Denies jaundice, hematemesis, fecal incontinence. GU : Denies urinary burning, urinary frequency, urinary hesitancy MS: Denies joint pain, muscle weakness, cramps, or limitation of movement.  Derm: Denies rash, itching, dry skin Psych: Denies depression, anxiety, memory loss, and confusion Heme: Denies bruising, bleeding, and enlarged lymph nodes.   Physical Exam   BP 125/80 (BP Location: Right Arm, Patient Position: Sitting, Cuff Size: Large)   Pulse 85   Temp 98.8 F (37.1 C) (Oral)   Ht 5' 9" (1.753 m)   Wt 227 lb (103 kg)   SpO2 97%   BMI 33.52 kg/m  General:   Alert and oriented. Pleasant and cooperative. Well-nourished and well-developed.  Head:  Normocephalic and atraumatic. Eyes:  Without icterus Cardiac: S1 S2 without murmurs Lungs: scattered rhonchi Abdomen:  +BS, soft, non-tender and non-distended. No HSM noted. No guarding or rebound. No masses appreciated.  Rectal:  Deferred  Msk:  Symmetrical without gross deformities. Normal  posture. Extremities:  Without edema. Neurologic:  Alert and  oriented x4;  grossly normal neurologically. Skin:  Intact without significant lesions or rashes. Psych:  Alert and cooperative. Normal mood and affect.   Assessment   Ethan Walls is a 62 y.o. male presenting today  at the request of Dr. Cook to be seen urgently in light of new onset melena. Colonoscopy up-to-date as of Nov 2023 with diverticulosis.   Suspected UGI bleed. He has had drift in his Hgb from 13.7 to 12.8 over past 24 hours; he notes melena has tapered some today. No abdominal pain noted. Denying any significant NSAID use, stating this is only sparingly during the year. Appreciate PCP starting patient on PPI BID and Carafate. Last EGD about 40 years ago per patient.   EGD planned for Monday. CBC again tomorrow, 4/19. If he has worsening anemia, melena, or symptomatic, he will go to the ED. In interim, he is to monitor for any concerning signs/symptoms or persistent melena.    Pneumonia: concern for pneumonia on CXR and started on abx per ED 4/16. He is without any DOE, SOB. No respiratory distress.   Patient is established with Dr. Rourk. However, due to need for timely EGD, Dr. Carver will be performing.     PLAN    CBC tomorrow (PCP has ordered) Proceed with upper endoscopy by Dr. Carver on Monday: the risks, benefits, and alternatives have been discussed with the patient in detail. The patient states understanding and desires to proceed. ASA 3 To ED if worsening symptoms, persistent melena, fatigue, DOE, etc. Patient fully aware Continue PPI BID   Amarion Portell W. Ladarien Beeks, PhD, ANP-BC [facility]    

## 2023-01-14 NOTE — Patient Instructions (Signed)
Keep appointment for having blood work done tomorrow. If it is dropping significantly, you may need to go to the emergency room. If it is stable, we will keep the plan for an upper endoscopy on Monday (this is the soonest we can do it).  Continue pantoprazole twice a day.   If you have any shortness of breath, fatigue, or persistent black stool, please go to the emergency room.  It was a pleasure to see you today. I want to create trusting relationships with patients and provide genuine, compassionate, and quality care. I truly value your feedback, so please be on the lookout for a survey regarding your visit with me today. I appreciate your time in completing this!    Gelene Mink, PhD, ANP-BC [facility]

## 2023-01-14 NOTE — Progress Notes (Signed)
Gastroenterology Office Note     Primary Care Physician:  Tommie Sams, DO  Primary Gastroenterologist: Dr. Jena Gauss    Chief Complaint   Chief Complaint  Patient presents with   Melena    Dr. Adriana Simas wanted him to be seen due to melena     History of Present Illness   Ethan Walls is a 62 y.o. male presenting today at the request of Dr. Adriana Simas to be seen urgently in light of new onset melena. Colonoscopy up-to-date as of Nov 2023 with diverticulosis.   Tuesday around 3-4am new onset black stool. Sooty black. Still having black stool. Last stool about 130pm today with 2 tbsp of black stool. Wednesday morning had red stool on tissue paper. Could see blood ring around black stool. Feels good. Denies fatigue. Denies dysphagia. Started pantoprazole yesterday. Has had hiccups since Saturday, even with sleeping. Hiccups intermittently.    Had to cough up a bunch of phlegm that was black and yellow. No fever or chills. Seldom any NSAIDs. Maybe once or twice a year. On antibiotics for pneumonia. No SOB. No DOE. Remote EGD about 40 years ago.    Hgb 13.7 two days ago, yesterday 12.8. Heme positive. CBC again tomorrow through PCP.   Past Medical History:  Diagnosis Date   BCC (basal cell carcinoma of skin) 03/03/2012   upper right shoulder tx with bx   Hiccups    Hypertension    Nodular basal cell carcinoma (BCC) 04/10/2015   Right Jawline (tx p bx)   Pigmented basal cell carcinoma (BCC) 07/06/2012   right clavicale tx with bx   SCCA (squamous cell carcinoma) of skin 07/05/2018   Left Forearm, Mid (in situ) (curet and 5FU)   SCCA (squamous cell carcinoma) of skin 07/05/2018   Right Front Scalp (in situ) (curet and 5FU)   SCCA (squamous cell carcinoma) of skin 01/27/2022   Right Supraclavicular Area(CX3)   Squamous cell carcinoma of skin 11/21/2008   Right Temple (well diff) (curet and 5FU)   Superficial basal cell carcinoma (BCC) 10/29/2008   right post shoulder cx3 82fu    Superficial basal cell carcinoma (BCC) 10/29/2008   left ant shoulder tx with bx   Superficial basal cell carcinoma (BCC) 10/29/2008   left side of the neck cx3 6fu   Superficial basal cell carcinoma (BCC) 01/07/2009   upper back tx with bx   Superficial basal cell carcinoma (BCC) 03/03/2012   right shoulder below previus bx tx with bx   Superficial basal cell carcinoma (BCC) 07/06/2012   left shoulder outer tx with bx   Superficial basal cell carcinoma (BCC) 10/31/2012   Left Shouder Outer (tx p bx)   Superficial basal cell carcinoma (BCC) 11/21/2014   Left Neck (curet and 5FU)   Superficial basal cell carcinoma (BCC) 07/05/2018   Right Neck Inferior (curet and 5FU)   Superficial basal cell carcinoma (BCC) 01/27/2022   Right Shoulder - posterior(CX3)   Superficial basal cell carcinoma (BCC) 01/27/2022   Right Posterior Mandible(CX3)   Superficial nodular basal cell carcinoma (BCC) 07/05/2018   Left Mid Back, Lower (curet and 5FU)   Superficial nodular basal cell carcinoma (BCC) 07/05/2018   Right Sideburn (curet and 5FU)   Superficial nodular basal cell carcinoma (BCC) 01/27/2022   Right Deltoid Inferior(CX3)    Past Surgical History:  Procedure Laterality Date   COLONOSCOPY N/A 12/19/2015   Procedure: COLONOSCOPY;  Surgeon: Corbin Ade, MD;  [facility];  Service: Endoscopy;  Laterality: N/A;  2:00 PM -  moved to 2:15 - office to notify   COLONOSCOPY WITH PROPOFOL N/A 08/03/2022   Procedure: COLONOSCOPY WITH PROPOFOL;  Surgeon: Corbin Ade, MD;  [facility];  Service: Endoscopy;  Laterality: N/A;  8:30am, asa 2   Left index finger      Current Outpatient Medications  Medication Sig Dispense Refill   amLODipine (NORVASC) 10 MG tablet Take 1 tablet (10 mg total) by mouth daily. 90 tablet 1   amoxicillin-clavulanate (AUGMENTIN) 875-125 MG tablet Take 1 tablet by mouth every 12 (twelve) hours for 7 days. 13 tablet 0   azithromycin (ZITHROMAX) 250  MG tablet Take 1 tablet (250 mg total) by mouth daily for 4 days. Take first 2 tablets together, then 1 every day until finished. 4 tablet 0   cholecalciferol (VITAMIN D3) 25 MCG (1000 UNIT) tablet Take 1,000 Units by mouth daily.     enalapril (VASOTEC) 20 MG tablet Take 1 tablet (20 mg total) by mouth 2 (two) times daily. 180 tablet 3   pantoprazole (PROTONIX) 40 MG tablet Take 1 tablet (40 mg total) by mouth 2 (two) times daily. 60 tablet 3   rosuvastatin (CRESTOR) 10 MG tablet Take 1 tablet (10 mg total) by mouth daily. 90 tablet 3   sucralfate (CARAFATE) 1 g tablet Take 1 tablet (1 g total) by mouth 4 (four) times daily. 120 tablet 1   No current facility-administered medications for this visit.    Allergies as of 01/14/2023   (No Known Allergies)    Family History  Problem Relation Age of Onset   Colon cancer Neg Hx     Social History   Socioeconomic History   Marital status: Married    Spouse name: Not on file   Number of children: Not on file   Years of education: Not on file   Highest education level: 12th grade  Occupational History   Not on file  Tobacco Use   Smoking status: Never   Smokeless tobacco: Never  Vaping Use   Vaping Use: Never used  Substance and Sexual Activity   Alcohol use: No   Drug use: No   Sexual activity: Yes  Other Topics Concern   Not on file  Social History Narrative   Not on file   Social Determinants of Health   Financial Resource Strain: Low Risk  (01/14/2023)   Overall Financial Resource Strain (CARDIA)    Difficulty of Paying Living Expenses: Not hard at all  Food Insecurity: No Food Insecurity (01/14/2023)   Hunger Vital Sign    Worried About Running Out of Food in the Last Year: Never true    Ran Out of Food in the Last Year: Never true  Transportation Needs: No Transportation Needs (01/14/2023)   PRAPARE - Administrator, Civil Service (Medical): No    Lack of Transportation (Non-Medical): No  Physical  Activity: Not on file  Stress: No Stress Concern Present (01/14/2023)   [employer] of Occupational Health - Occupational Stress Questionnaire    Feeling of Stress : Not at all  Social Connections: Unknown (01/14/2023)   Social Connection and Isolation Panel [NHANES]    Frequency of Communication with Friends and Family: More than three times a week    Frequency of Social Gatherings with Friends and Family: Once a week    Attends Religious Services: Patient declined    Database administrator or Organizations: No  Attends Banker Meetings: Not on file    Marital Status: Married  Catering manager Violence: Not on file     Review of Systems   Gen: Denies any fever, chills, fatigue, weight loss, lack of appetite.  CV: Denies chest pain, heart palpitations, peripheral edema, syncope.  Resp: Denies shortness of breath at rest or with exertion. Denies wheezing or cough.  GI: Denies dysphagia or odynophagia. Denies jaundice, hematemesis, fecal incontinence. GU : Denies urinary burning, urinary frequency, urinary hesitancy MS: Denies joint pain, muscle weakness, cramps, or limitation of movement.  Derm: Denies rash, itching, dry skin Psych: Denies depression, anxiety, memory loss, and confusion Heme: Denies bruising, bleeding, and enlarged lymph nodes.   Physical Exam   BP 125/80 (BP Location: Right Arm, Patient Position: Sitting, Cuff Size: Large)   Pulse 85   Temp 98.8 F (37.1 C) (Oral)   Ht 5\' 9"  (1.753 m)   Wt 227 lb (103 kg)   SpO2 97%   BMI 33.52 kg/m  General:   Alert and oriented. Pleasant and cooperative. Well-nourished and well-developed.  Head:  Normocephalic and atraumatic. Eyes:  Without icterus Cardiac: S1 S2 without murmurs Lungs: scattered rhonchi Abdomen:  +BS, soft, non-tender and non-distended. No HSM noted. No guarding or rebound. No masses appreciated.  Rectal:  Deferred  Msk:  Symmetrical without gross deformities. Normal  posture. Extremities:  Without edema. Neurologic:  Alert and  oriented x4;  grossly normal neurologically. Skin:  Intact without significant lesions or rashes. Psych:  Alert and cooperative. Normal mood and affect.   Assessment   MAVERIC DEBONO is a 62 y.o. male presenting today  at the request of Dr. Adriana Simas to be seen urgently in light of new onset melena. Colonoscopy up-to-date as of Nov 2023 with diverticulosis.   Suspected UGI bleed. He has had drift in his Hgb from 13.7 to 12.8 over past 24 hours; he notes melena has tapered some today. No abdominal pain noted. Denying any significant NSAID use, stating this is only sparingly during the year. Appreciate PCP starting patient on PPI BID and Carafate. Last EGD about 40 years ago per patient.   EGD planned for Monday. CBC again tomorrow, 4/19. If he has worsening anemia, melena, or symptomatic, he will go to the ED. In interim, he is to monitor for any concerning signs/symptoms or persistent melena.    Pneumonia: concern for pneumonia on CXR and started on abx per ED 4/16. He is without any DOE, SOB. No respiratory distress.   Patient is established with Dr. Jena Gauss. However, due to need for timely EGD, Dr. Marletta Lor will be performing.     PLAN    CBC tomorrow (PCP has ordered) Proceed with upper endoscopy by Dr. Marletta Lor on Monday: the risks, benefits, and alternatives have been discussed with the patient in detail. The patient states understanding and desires to proceed. ASA 3 To ED if worsening symptoms, persistent melena, fatigue, DOE, etc. Patient fully aware Continue PPI BID   Ethan Mink, PhD, [facility]

## 2023-01-15 ENCOUNTER — Encounter (HOSPITAL_COMMUNITY)
Admission: RE | Admit: 2023-01-15 | Discharge: 2023-01-15 | Disposition: A | Discharge status: Home / Self Care | Payer: BC Managed Care – PPO | Source: Ambulatory Visit | Attending: Internal Medicine | Admitting: Internal Medicine

## 2023-01-15 ENCOUNTER — Encounter (HOSPITAL_COMMUNITY): Payer: Self-pay

## 2023-01-15 ENCOUNTER — Other Ambulatory Visit (HOSPITAL_COMMUNITY)
Admission: RE | Admit: 2023-01-15 | Discharge: 2023-01-15 | Disposition: A | Discharge status: Home / Self Care | Payer: BC Managed Care – PPO | Source: Ambulatory Visit | Attending: Family Medicine | Admitting: Family Medicine

## 2023-01-15 DIAGNOSIS — K921 Melena: Secondary | ICD-10-CM | POA: Insufficient documentation

## 2023-01-15 DIAGNOSIS — Z79899 Other long term (current) drug therapy: Secondary | ICD-10-CM | POA: Diagnosis not present

## 2023-01-15 DIAGNOSIS — I10 Essential (primary) hypertension: Secondary | ICD-10-CM | POA: Diagnosis not present

## 2023-01-15 DIAGNOSIS — Z01812 Encounter for preprocedural laboratory examination: Secondary | ICD-10-CM | POA: Insufficient documentation

## 2023-01-15 DIAGNOSIS — R066 Hiccough: Secondary | ICD-10-CM | POA: Diagnosis not present

## 2023-01-15 DIAGNOSIS — K2970 Gastritis, unspecified, without bleeding: Secondary | ICD-10-CM | POA: Diagnosis not present

## 2023-01-15 DIAGNOSIS — K222 Esophageal obstruction: Secondary | ICD-10-CM | POA: Diagnosis not present

## 2023-01-15 DIAGNOSIS — R7989 Other specified abnormal findings of blood chemistry: Secondary | ICD-10-CM | POA: Insufficient documentation

## 2023-01-15 DIAGNOSIS — K219 Gastro-esophageal reflux disease without esophagitis: Secondary | ICD-10-CM | POA: Diagnosis not present

## 2023-01-15 DIAGNOSIS — K449 Diaphragmatic hernia without obstruction or gangrene: Secondary | ICD-10-CM | POA: Diagnosis not present

## 2023-01-15 LAB — CBC
HCT: 36.7 % — ABNORMAL LOW (ref 39.0–52.0)
Hemoglobin: 12.5 g/dL — ABNORMAL LOW (ref 13.0–17.0)
MCH: 29.2 pg (ref 26.0–34.0)
MCHC: 34.1 g/dL (ref 30.0–36.0)
MCV: 85.7 fL (ref 80.0–100.0)
Platelets: 251 10*3/uL (ref 150–400)
RBC: 4.28 MIL/uL (ref 4.22–5.81)
RDW: 13.1 % (ref 11.5–15.5)
WBC: 9.3 10*3/uL (ref 4.0–10.5)
nRBC: 0 % (ref 0.0–0.2)

## 2023-01-15 LAB — BASIC METABOLIC PANEL
Anion gap: 9 (ref 5–15)
BUN: 15 mg/dL (ref 8–23)
CO2: 24 mmol/L (ref 22–32)
Calcium: 8.7 mg/dL — ABNORMAL LOW (ref 8.9–10.3)
Chloride: 103 mmol/L (ref 98–111)
Creatinine, Ser: 1.33 mg/dL — ABNORMAL HIGH (ref 0.61–1.24)
GFR, Estimated: 60 mL/min — ABNORMAL LOW (ref >60)
Glucose, Bld: 115 mg/dL — ABNORMAL HIGH (ref 70–99)
Potassium: 3.8 mmol/L (ref 3.5–5.1)
Sodium: 136 mmol/L (ref 135–145)

## 2023-01-18 ENCOUNTER — Ambulatory Visit: Payer: BC Managed Care – PPO | Admitting: Family Medicine

## 2023-01-18 ENCOUNTER — Encounter (HOSPITAL_COMMUNITY)
Admission: RE | Disposition: A | Discharge status: Home / Self Care | Payer: Self-pay | Source: Home / Self Care | Attending: Internal Medicine

## 2023-01-18 ENCOUNTER — Encounter: Payer: Self-pay | Admitting: Internal Medicine

## 2023-01-18 ENCOUNTER — Other Ambulatory Visit: Payer: Self-pay

## 2023-01-18 ENCOUNTER — Ambulatory Visit (HOSPITAL_COMMUNITY): Payer: BC Managed Care – PPO | Admitting: Anesthesiology

## 2023-01-18 ENCOUNTER — Encounter (HOSPITAL_COMMUNITY): Payer: Self-pay

## 2023-01-18 ENCOUNTER — Ambulatory Visit (HOSPITAL_COMMUNITY)
Admission: RE | Admit: 2023-01-18 | Discharge: 2023-01-18 | Disposition: A | Discharge status: Home / Self Care | Payer: BC Managed Care – PPO | Attending: Internal Medicine | Admitting: Internal Medicine

## 2023-01-18 DIAGNOSIS — K921 Melena: Secondary | ICD-10-CM | POA: Insufficient documentation

## 2023-01-18 DIAGNOSIS — K2970 Gastritis, unspecified, without bleeding: Secondary | ICD-10-CM | POA: Diagnosis not present

## 2023-01-18 DIAGNOSIS — K222 Esophageal obstruction: Secondary | ICD-10-CM | POA: Diagnosis not present

## 2023-01-18 DIAGNOSIS — K449 Diaphragmatic hernia without obstruction or gangrene: Secondary | ICD-10-CM | POA: Diagnosis not present

## 2023-01-18 DIAGNOSIS — K219 Gastro-esophageal reflux disease without esophagitis: Secondary | ICD-10-CM | POA: Insufficient documentation

## 2023-01-18 DIAGNOSIS — R066 Hiccough: Secondary | ICD-10-CM | POA: Insufficient documentation

## 2023-01-18 DIAGNOSIS — R7989 Other specified abnormal findings of blood chemistry: Secondary | ICD-10-CM | POA: Insufficient documentation

## 2023-01-18 DIAGNOSIS — Z79899 Other long term (current) drug therapy: Secondary | ICD-10-CM | POA: Insufficient documentation

## 2023-01-18 DIAGNOSIS — I10 Essential (primary) hypertension: Secondary | ICD-10-CM | POA: Insufficient documentation

## 2023-01-18 HISTORY — PX: BIOPSY: SHX5522

## 2023-01-18 HISTORY — PX: ESOPHAGOGASTRODUODENOSCOPY (EGD) WITH PROPOFOL: SHX5813

## 2023-01-18 SURGERY — ESOPHAGOGASTRODUODENOSCOPY (EGD) WITH PROPOFOL
Anesthesia: General

## 2023-01-18 MED ORDER — LACTATED RINGERS IV SOLN: INTRAVENOUS | Status: DC

## 2023-01-18 MED ORDER — STERILE WATER FOR IRRIGATION IR SOLN
Status: DC | PRN
  Administered 2023-01-18: .6 mL

## 2023-01-18 MED ORDER — PROPOFOL 10 MG/ML IV BOLUS
INTRAVENOUS | Status: DC | PRN
  Administered 2023-01-18: 150 mg via INTRAVENOUS
  Administered 2023-01-18: 100 mg via INTRAVENOUS

## 2023-01-18 MED ORDER — LIDOCAINE HCL (CARDIAC) PF 100 MG/5ML IV SOSY
PREFILLED_SYRINGE | INTRAVENOUS | Status: DC | PRN
  Administered 2023-01-18: 80 mg via INTRAVENOUS

## 2023-01-18 NOTE — Anesthesia Postprocedure Evaluation (Signed)
Anesthesia Post Note  Patient: BRAVEN WOLK  Procedure(s) Performed: ESOPHAGOGASTRODUODENOSCOPY (EGD) WITH PROPOFOL BIOPSY  Patient location during evaluation: Phase II Anesthesia Type: General Level of consciousness: awake and alert and oriented Pain management: pain level controlled Vital Signs Assessment: post-procedure vital signs reviewed and stable Respiratory status: spontaneous breathing, nonlabored ventilation and respiratory function stable Cardiovascular status: blood pressure returned to baseline and stable Postop Assessment: no apparent nausea or vomiting Anesthetic complications: no  No notable events documented.   Last Vitals:  Vitals:   01/18/23 0755 01/18/23 0847  BP: 137/86 (!) 98/56  Pulse: 65 71  Resp: 16   Temp: 36.8 C 36.6 C  SpO2: 100% 95%    Last Pain:  Vitals:   01/18/23 0847  TempSrc: Oral  PainSc: 0-No pain                 Ernesta Trabert C Ivionna Verley

## 2023-01-18 NOTE — Discharge Instructions (Addendum)
EGD Discharge instructions Please read the instructions outlined below and refer to this sheet in the next few weeks. These discharge instructions provide you with general information on caring for yourself after you leave the hospital. Your doctor may also give you specific instructions. While your treatment has been planned according to the most current medical practices available, unavoidable complications occasionally occur. If you have any problems or questions after discharge, please call your doctor. ACTIVITY You may resume your regular activity but move at a slower pace for the next 24 hours.  Take frequent rest periods for the next 24 hours.  Walking will help expel (get rid of) the air and reduce the bloated feeling in your abdomen.  No driving for 24 hours (because of the anesthesia (medicine) used during the test).  You may shower.  Do not sign any important legal documents or operate any machinery for 24 hours (because of the anesthesia used during the test).  NUTRITION Drink plenty of fluids.  You may resume your normal diet.  Begin with a light meal and progress to your normal diet.  Avoid alcoholic beverages for 24 hours or as instructed by your caregiver.  MEDICATIONS You may resume your normal medications unless your caregiver tells you otherwise.  WHAT YOU CAN EXPECT TODAY You may experience abdominal discomfort such as a feeling of fullness or "gas" pains.  FOLLOW-UP Your doctor will discuss the results of your test with you.  SEEK IMMEDIATE MEDICAL ATTENTION IF ANY OF THE FOLLOWING OCCUR: Excessive nausea (feeling sick to your stomach) and/or vomiting.  Severe abdominal pain and distention (swelling).  Trouble swallowing.  Temperature over 101 F (37.8 C).  Rectal bleeding or vomiting of blood.   Your EGD revealed mild amount inflammation in your stomach.  I took biopsies of this to rule out infection with a bacteria called H. pylori.  Await pathology results, my  office will contact you.  You have a medium size hiatal hernia as well as a Schatzki's ring in the end portion of your esophagus.  Small bowel appeared normal.  What ever bled previously appears to have healed.  Continue on pantoprazole twice daily.  Follow-up in GI clinic in 4 to 6 weeks.     I hope you have a great rest of your week!  Hennie Duos. Marletta Lor, D.O. Gastroenterology and Hepatology [facility]

## 2023-01-18 NOTE — Anesthesia Procedure Notes (Signed)
Date/Time: 01/18/2023 8:37 AM  Performed by: Franco Nones, CRNAPre-anesthesia Checklist: Patient identified, Emergency Drugs available, Suction available, Timeout performed and Patient being monitored Patient Re-evaluated:Patient Re-evaluated prior to induction Oxygen Delivery Method: Nasal Cannula

## 2023-01-18 NOTE — Op Note (Signed)
[facility] Patient Name: Ethan Walls Procedure Date: 01/18/2023 8:27 AM MRN: [id number] Date of Birth: [birth year] Attending MD: Hennie Duos. Marletta Lor , [place], [id number] CSN: [id number] Age: 62 Admit Type: Outpatient Procedure:                Upper GI endoscopy Indications:              Melena, Hiccups Providers:                Hennie Duos. Marletta Lor, DO, Jannett Celestine, RN, Angelica Ran,                            Pandora Leiter, Technician Referring MD:              Medicines:                See the Anesthesia note for documentation of the                            administered medications Complications:            No immediate complications. Estimated Blood Loss:     Estimated blood loss was minimal. Procedure:                Pre-Anesthesia Assessment:                           - The anesthesia plan was to use monitored                            anesthesia care (MAC).                           After obtaining informed consent, the endoscope was                            passed under direct vision. Throughout the                            procedure, the patient's blood pressure, pulse, and                            oxygen saturations were monitored continuously. The                            [id number]-H190 ([id number]) scope was introduced through the                            mouth, and advanced to the second part of duodenum.                            The upper GI endoscopy was accomplished without                            difficulty. The patient tolerated the procedure                            well. Scope In:  8:42:17 AM Scope Out: 8:46:46 AM Total Procedure Duration: 0 hours 4 minutes 29 seconds  Findings:      A 4 cm hiatal hernia was present.      A mild Schatzki ring was found in the distal esophagus.      Patchy mild inflammation characterized by erythema was found in the       gastric body and in the gastric antrum. Biopsies were taken with a cold       forceps for  Helicobacter pylori testing.      The duodenal bulb, first portion of the duodenum and second portion of       the duodenum were normal. Impression:               - 4 cm hiatal hernia.                           - Mild Schatzki ring.                           - Gastritis. Biopsied.                           - Normal duodenal bulb, first portion of the                            duodenum and second portion of the duodenum. Moderate Sedation:      Per Anesthesia Care Recommendation:           - Patient has a contact number available for                            emergencies. The signs and symptoms of potential                            delayed complications were discussed with the                            patient. Return to normal activities tomorrow.                            Written discharge instructions were provided to the                            patient.                           - Resume previous diet.                           - Continue present medications.                           - Await pathology results.                           - Use Protonix (pantoprazole) 40 mg PO BID.                           -  Return to GI clinic in 4-6 weeks.                           - Patient did not report dysphagia prior to                            endoscopy. Can consider repeat EGD with dilation of                            Schatzki ring if develops symptoms related to this. Procedure Code(s):        --- Professional ---                           [phone], Esophagogastroduodenoscopy, flexible,                            transoral; with biopsy, single or multiple Diagnosis Code(s):        --- Professional ---                           K44.9, Diaphragmatic hernia without obstruction or                            gangrene                           K22.2, Esophageal obstruction                           K29.70, Gastritis, unspecified, without bleeding                           K92.1, Melena  (includes Hematochezia) CPT copyright 2022 American Medical Association. All rights reserved. The codes documented in this report are preliminary and upon coder review may  be revised to meet current compliance requirements. Hennie Duos. Marletta Lor, DO Hennie Duos. Marletta Lor, DO 01/18/2023 8:50:14 AM This report has been signed electronically. Number of Addenda: 0

## 2023-01-18 NOTE — Transfer of Care (Signed)
Immediate Anesthesia Transfer of Care Note  Patient: Ethan Walls  Procedure(s) Performed: ESOPHAGOGASTRODUODENOSCOPY (EGD) WITH PROPOFOL BIOPSY  Patient Location: Short Stay  Anesthesia Type:General  Level of Consciousness: drowsy and patient cooperative  Airway & Oxygen Therapy: Patient Spontanous Breathing  Post-op Assessment: Report given to RN and Post -op Vital signs reviewed and stable  Post vital signs: Reviewed and stable98/56  Last Vitals:  Vitals Value Taken Time  BP 98/56 0852  Temp 97.7 0852  Pulse 72 0852  Resp 27 0852  SpO2 96 0852    Last Pain:  Vitals:   01/18/23 0847  TempSrc: Oral  PainSc: 0-No pain      Patients Stated Pain Goal: 8 (01/18/23 0745)  Complications: No notable events documented.

## 2023-01-18 NOTE — Anesthesia Preprocedure Evaluation (Signed)
Anesthesia Evaluation  Patient identified by MRN, date of birth, ID band Patient awake    Reviewed: Allergy & Precautions, H&P , NPO status , Patient's Chart, lab work & pertinent test results  History of Anesthesia Complications Negative for: history of anesthetic complications  Airway Mallampati: II  TM Distance: >3 FB Neck ROM: Full    Dental  (+) Dental Advisory Given, Chipped   Pulmonary neg pulmonary ROS   Pulmonary exam normal breath sounds clear to auscultation       Cardiovascular Exercise Tolerance: Good hypertension, Pt. on medications Normal cardiovascular exam Rhythm:Regular Rate:Normal     Neuro/Psych negative neurological ROS  negative psych ROS   GI/Hepatic Neg liver ROS,GERD  Medicated and Controlled,,Melena    Endo/Other  negative endocrine ROS    Renal/GU negative Renal ROS  negative genitourinary   Musculoskeletal negative musculoskeletal ROS (+)    Abdominal   Peds negative pediatric ROS (+)  Hematology negative hematology ROS (+)   Anesthesia Other Findings Skin cancer- basal cell carcinoma, squamous cell carcinoma Hiccups Melena    Reproductive/Obstetrics negative OB ROS                             Anesthesia Physical Anesthesia Plan  ASA: 2  Anesthesia Plan: General   Post-op Pain Management: Minimal or no pain anticipated   Induction: Intravenous  PONV Risk Score and Plan: Propofol infusion  Airway Management Planned: Natural Airway and Nasal Cannula  Additional Equipment:   Intra-op Plan:   Post-operative Plan:   Informed Consent: I have reviewed the patients History and Physical, chart, labs and discussed the procedure including the risks, benefits and alternatives for the proposed anesthesia with the patient or authorized representative who has indicated his/her understanding and acceptance.     Dental advisory given  Plan Discussed  with: CRNA and Surgeon  Anesthesia Plan Comments:         Anesthesia Quick Evaluation

## 2023-01-18 NOTE — Interval H&P Note (Signed)
History and Physical Interval Note:  01/18/2023 8:27 AM  Ethan Walls  has presented today for surgery, with the diagnosis of melena.  The various methods of treatment have been discussed with the patient and family. After consideration of risks, benefits and other options for treatment, the patient has consented to  Procedure(s) with comments: ESOPHAGOGASTRODUODENOSCOPY (EGD) WITH PROPOFOL (N/A) - 8:45am;asa 3 as a surgical intervention.  The patient's history has been reviewed, patient examined, no change in status, stable for surgery.  I have reviewed the patient's chart and labs.  Questions were answered to the patient's satisfaction.     Lanelle Bal

## 2023-01-19 ENCOUNTER — Ambulatory Visit: Payer: BC Managed Care – PPO | Admitting: Gastroenterology

## 2023-01-19 LAB — SURGICAL PATHOLOGY

## 2023-01-22 ENCOUNTER — Encounter (HOSPITAL_COMMUNITY): Payer: Self-pay | Admitting: Internal Medicine

## 2023-01-27 ENCOUNTER — Ambulatory Visit: Payer: BC Managed Care – PPO | Admitting: Family Medicine

## 2023-01-29 ENCOUNTER — Ambulatory Visit (HOSPITAL_COMMUNITY)
Admission: RE | Admit: 2023-01-29 | Discharge: 2023-01-29 | Disposition: A | Discharge status: Home / Self Care | Payer: BC Managed Care – PPO | Source: Ambulatory Visit | Attending: Family Medicine | Admitting: Family Medicine

## 2023-01-29 ENCOUNTER — Ambulatory Visit: Payer: BC Managed Care – PPO | Admitting: Family Medicine

## 2023-01-29 ENCOUNTER — Encounter: Payer: Self-pay | Admitting: Family Medicine

## 2023-01-29 ENCOUNTER — Telehealth: Payer: Self-pay

## 2023-01-29 VITALS — BP 117/71 | HR 67 | Temp 98.6°F | Ht 69.0 in | Wt 224.0 lb

## 2023-01-29 DIAGNOSIS — J189 Pneumonia, unspecified organism: Secondary | ICD-10-CM | POA: Insufficient documentation

## 2023-01-29 DIAGNOSIS — R051 Acute cough: Secondary | ICD-10-CM | POA: Insufficient documentation

## 2023-01-29 MED ORDER — LEVOFLOXACIN 750 MG PO TABS
750.0000 mg | ORAL_TABLET | Freq: Every day | ORAL | 0 refills | Status: DC
Start: 2023-01-29 — End: 2023-02-11

## 2023-01-29 NOTE — Assessment & Plan Note (Signed)
Chest x-ray obtained today.  Chest x-ray independently reviewed by me.  Interpretation: Left lower lobe opacity still noted.  I am placing patient on antibiotic therapy.  Follow-up in 3 to 4 weeks.

## 2023-01-29 NOTE — Progress Notes (Signed)
Subjective:  Patient ID: Ethan Walls, male    DOB: May 24, 1961  Age: 62 y.o. MRN: [id number]  CC: Chief Complaint  Patient presents with   nasal drainage     Clear    Cough    Intermittent cough and congestion, some wheezing completed antibiotics for pneumonia     HPI:  62 year old male presents for evaluation the above.  Patient with recent pneumonia, diagnosed on 4/16.  He also had a recent GI bleed.  He has completed antibiotic therapy.  He reports that he is feeling much better but is still having some cough and drainage.  He states that he has a slight rattle and wheeze at times.  No fever.  No significant shortness of breath.  No other complaints.  Patient Active Problem List   Diagnosis Date Noted   Community acquired pneumonia 01/29/2023   UGI bleed 01/14/2023   Melena 01/13/2023   Intractable hiccups 01/13/2023   Chronic pain of left knee 07/30/2022   Healthcare maintenance 01/21/2022   BPH (benign prostatic hyperplasia) 07/10/2021   History of colonic polyps    Essential hypertension, benign 02/18/2014   Hyperlipidemia 02/18/2014    Social Hx   Social History   Socioeconomic History   Marital status: Married    Spouse name: Not on file   Number of children: Not on file   Years of education: Not on file   Highest education level: 12th grade  Occupational History   Not on file  Tobacco Use   Smoking status: Never   Smokeless tobacco: Never  Vaping Use   Vaping Use: Never used  Substance and Sexual Activity   Alcohol use: No   Drug use: No   Sexual activity: Yes  Other Topics Concern   Not on file  Social History Narrative   Not on file   Social Determinants of Health   Financial Resource Strain: Low Risk  (01/14/2023)   Overall Financial Resource Strain (CARDIA)    Difficulty of Paying Living Expenses: Not hard at all  Food Insecurity: No Food Insecurity (01/14/2023)   Hunger Vital Sign    Worried About Running Out of Food in the Last Year:  Never true    Ran Out of Food in the Last Year: Never true  Transportation Needs: No Transportation Needs (01/14/2023)   PRAPARE - Administrator, Civil Service (Medical): No    Lack of Transportation (Non-Medical): No  Physical Activity: Not on file  Stress: No Stress Concern Present (01/14/2023)   [employer] of Occupational Health - Occupational Stress Questionnaire    Feeling of Stress : Not at all  Social Connections: Unknown (01/14/2023)   Social Connection and Isolation Panel [NHANES]    Frequency of Communication with Friends and Family: More than three times a week    Frequency of Social Gatherings with Friends and Family: Once a week    Attends Religious Services: Patient declined    Database administrator or Organizations: No    Attends Engineer, structural: Not on file    Marital Status: Married    Review of Systems Per HPI  Objective:  BP 117/71   Pulse 67   Temp 98.6 F (37 C)   Ht 5\' 9"  (1.753 m)   Wt 224 lb (101.6 kg)   SpO2 97%   BMI 33.08 kg/m      01/29/2023   11:10 AM 01/18/2023    8:47 AM 01/18/2023    7:55 AM  BP/Weight  Systolic BP 117 98 137  Diastolic BP 71 56 86  Wt. (Lbs) 224    BMI 33.08 kg/m2      Physical Exam Vitals and nursing note reviewed.  Constitutional:      General: He is not in acute distress.    Appearance: Normal appearance. He is obese.  HENT:     Head: Normocephalic and atraumatic.  Cardiovascular:     Rate and Rhythm: Normal rate and regular rhythm.  Pulmonary:     Effort: Pulmonary effort is normal.     Breath sounds: Normal breath sounds. No wheezing or rales.  Neurological:     Mental Status: He is alert.  Psychiatric:        Mood and Affect: Mood normal.        Behavior: Behavior normal.     Lab Results  Component Value Date   WBC 9.3 01/15/2023   HGB 12.5 (L) 01/15/2023   HCT 36.7 (L) 01/15/2023   PLT 251 01/15/2023   GLUCOSE 115 (H) 01/15/2023   CHOL 147 07/29/2022   TRIG 97  07/29/2022   HDL 54 07/29/2022   LDLCALC 75 07/29/2022   ALT 27 01/13/2023   AST 24 01/13/2023   NA 136 01/15/2023   K 3.8 01/15/2023   CL 103 01/15/2023   CREATININE 1.33 (H) 01/15/2023   BUN 15 01/15/2023   CO2 24 01/15/2023   PSA 0.21 02/15/2014     Assessment & Plan:   Problem List Items Addressed This Visit       Respiratory   Community acquired pneumonia - Primary    Chest x-ray obtained today.  Chest x-ray independently reviewed by me.  Interpretation: Left lower lobe opacity still noted.  I am placing patient on antibiotic therapy.  Follow-up in 3 to 4 weeks.      Relevant Medications   levofloxacin (LEVAQUIN) 750 MG tablet   Other Visit Diagnoses     Acute cough       Relevant Orders   DG Chest 2 View (Completed)       Meds ordered this encounter  Medications   levofloxacin (LEVAQUIN) 750 MG tablet    Sig: Take 1 tablet (750 mg total) by mouth daily.    Dispense:  7 tablet    Refill:  0    Follow-up:  3-4 weeks.  Everlene Other DO [facility]

## 2023-01-29 NOTE — Telephone Encounter (Signed)
Called patient and left message for patient to call office. (RE: Pneumonia still noted.  I have placed him on antibiotic therapy.  He needs to see me in 3 to 4 weeks.)

## 2023-01-29 NOTE — Patient Instructions (Signed)
Lungs clear.  Xray today.  We will call with the results.  Take care  Dr. Adriana Simas

## 2023-02-11 ENCOUNTER — Encounter: Payer: Self-pay | Admitting: Gastroenterology

## 2023-02-11 ENCOUNTER — Ambulatory Visit (INDEPENDENT_AMBULATORY_CARE_PROVIDER_SITE_OTHER): Payer: BC Managed Care – PPO | Admitting: Gastroenterology

## 2023-02-11 VITALS — BP 114/74 | HR 71 | Temp 98.6°F | Ht 69.0 in | Wt 229.1 lb

## 2023-02-11 DIAGNOSIS — K922 Gastrointestinal hemorrhage, unspecified: Secondary | ICD-10-CM

## 2023-02-11 MED ORDER — PANTOPRAZOLE SODIUM 40 MG PO TBEC: 40.0000 mg | DELAYED_RELEASE_TABLET | Freq: Two times a day (BID) | ORAL | 3 refills | Status: DC

## 2023-02-11 NOTE — Progress Notes (Signed)
Gastroenterology Office Note     Primary Care Physician:  Tommie Sams, DO  Primary Gastroenterologist: Dr. Jena Gauss    Chief Complaint   Chief Complaint  Patient presents with   Follow-up    Patient here today for a follow up visit. Patient says he is having episode of hiccups as of late. Patient was given pantoprazole 40 mg bid by pcp, which he will need refills on if we plan on keeping him on this.      History of Present Illness   Ethan Walls is a 62 y.o. male presenting today in follow-up with a history of new onset melena, slight anemia, heme positive stool in April 2024.  Hgb had drifted from 13.7 to 12.5. EGD completed with hiatal hernia, mild Schatzki ring, gastritis with negative H.pylori.    Colonoscopy up-to-date as of Nov 2023 with diverticulosis.   No nausea. No early satiety. Hiccups for a few minutes after eating. Water or tea. Tea more rare now. Drinks one pepsi a day. Eats what he wants. Doesn't avoid fried foods. No dysphagia. No melena or hematochezia. Hiccups had been persistent previously for about a week. Avoiding NSAIDs. However, he had taken Ibuprofen at Christmas for about 2 weeks but otherwise does not.    Past Medical History:  Diagnosis Date   BCC (basal cell carcinoma of skin) 03/03/2012   upper right shoulder tx with bx   Hiccups    Hypertension    Nodular basal cell carcinoma (BCC) 04/10/2015   Right Jawline (tx p bx)   Pigmented basal cell carcinoma (BCC) 07/06/2012   right clavicale tx with bx   SCCA (squamous cell carcinoma) of skin 07/05/2018   Left Forearm, Mid (in situ) (curet and 5FU)   SCCA (squamous cell carcinoma) of skin 07/05/2018   Right Front Scalp (in situ) (curet and 5FU)   SCCA (squamous cell carcinoma) of skin 01/27/2022   Right Supraclavicular Area(CX3)   Squamous cell carcinoma of skin 11/21/2008   Right Temple (well diff) (curet and 5FU)   Superficial basal cell carcinoma (BCC) 10/29/2008   right post  shoulder cx3 27fu   Superficial basal cell carcinoma (BCC) 10/29/2008   left ant shoulder tx with bx   Superficial basal cell carcinoma (BCC) 10/29/2008   left side of the neck cx3 81fu   Superficial basal cell carcinoma (BCC) 01/07/2009   upper back tx with bx   Superficial basal cell carcinoma (BCC) 03/03/2012   right shoulder below previus bx tx with bx   Superficial basal cell carcinoma (BCC) 07/06/2012   left shoulder outer tx with bx   Superficial basal cell carcinoma (BCC) 10/31/2012   Left Shouder Outer (tx p bx)   Superficial basal cell carcinoma (BCC) 11/21/2014   Left Neck (curet and 5FU)   Superficial basal cell carcinoma (BCC) 07/05/2018   Right Neck Inferior (curet and 5FU)   Superficial basal cell carcinoma (BCC) 01/27/2022   Right Shoulder - posterior(CX3)   Superficial basal cell carcinoma (BCC) 01/27/2022   Right Posterior Mandible(CX3)   Superficial nodular basal cell carcinoma (BCC) 07/05/2018   Left Mid Back, Lower (curet and 5FU)   Superficial nodular basal cell carcinoma (BCC) 07/05/2018   Right Sideburn (curet and 5FU)   Superficial nodular basal cell carcinoma (BCC) 01/27/2022   Right Deltoid Inferior(CX3)    Past Surgical History:  Procedure Laterality Date   BIOPSY  01/18/2023   Procedure: BIOPSY;  Surgeon: Lanelle Bal,  DO;  [facility]: [facility];  Service: Endoscopy;;   COLONOSCOPY N/A 12/19/2015   Procedure: COLONOSCOPY;  Surgeon: Corbin Ade, MD;  Location: [facility];  Service: Endoscopy;  Laterality: N/A;  2:00 PM -  moved to 2:15 - office to notify   COLONOSCOPY WITH PROPOFOL N/A 08/03/2022   Procedure: COLONOSCOPY WITH PROPOFOL;  Surgeon: Corbin Ade, MD;  Location: [facility];  Service: Endoscopy;  Laterality: N/A;  8:30am, asa 2   ESOPHAGOGASTRODUODENOSCOPY (EGD) WITH PROPOFOL N/A 01/18/2023   Procedure: ESOPHAGOGASTRODUODENOSCOPY (EGD) WITH PROPOFOL;  Surgeon: Lanelle Bal, DO;  Location: [facility];  Service:  Endoscopy;  Laterality: N/A;  8:45am;asa 3   Left index finger      Current Outpatient Medications  Medication Sig Dispense Refill   amLODipine (NORVASC) 10 MG tablet Take 1 tablet (10 mg total) by mouth daily. 90 tablet 1   cholecalciferol (VITAMIN D3) 25 MCG (1000 UNIT) tablet Take 1,000 Units by mouth daily.     enalapril (VASOTEC) 20 MG tablet Take 1 tablet (20 mg total) by mouth 2 (two) times daily. 180 tablet 3   pantoprazole (PROTONIX) 40 MG tablet Take 1 tablet (40 mg total) by mouth 2 (two) times daily. 60 tablet 3   rosuvastatin (CRESTOR) 10 MG tablet Take 1 tablet (10 mg total) by mouth daily. 90 tablet 3   No current facility-administered medications for this visit.    Allergies as of 02/11/2023   (No Known Allergies)    Family History  Problem Relation Age of Onset   Colon cancer Neg Hx     Social History   Socioeconomic History   Marital status: Married    Spouse name: Not on file   Number of children: Not on file   Years of education: Not on file   Highest education level: 12th grade  Occupational History   Not on file  Tobacco Use   Smoking status: Never   Smokeless tobacco: Never  Vaping Use   Vaping Use: Never used  Substance and Sexual Activity   Alcohol use: No   Drug use: No   Sexual activity: Yes  Other Topics Concern   Not on file  Social History Narrative   Not on file   Social Determinants of Health   Financial Resource Strain: Low Risk  (01/14/2023)   Overall Financial Resource Strain (CARDIA)    Difficulty of Paying Living Expenses: Not hard at all  Food Insecurity: No Food Insecurity (01/14/2023)   Hunger Vital Sign    Worried About Running Out of Food in the Last Year: Never true    Ran Out of Food in the Last Year: Never true  Transportation Needs: No Transportation Needs (01/14/2023)   PRAPARE - Administrator, Civil Service (Medical): No    Lack of Transportation (Non-Medical): No  Physical Activity: Not on file   Stress: No Stress Concern Present (01/14/2023)   [employer] of Occupational Health - Occupational Stress Questionnaire    Feeling of Stress : Not at all  Social Connections: Unknown (01/14/2023)   Social Connection and Isolation Panel [NHANES]    Frequency of Communication with Friends and Family: More than three times a week    Frequency of Social Gatherings with Friends and Family: Once a week    Attends Religious Services: Patient declined    Database administrator or Organizations: No    Attends Banker Meetings: Not on file    Marital  Status: Married  Catering manager Violence: Not on file     Review of Systems   Gen: Denies any fever, chills, fatigue, weight loss, lack of appetite.  CV: Denies chest pain, heart palpitations, peripheral edema, syncope.  Resp: Denies shortness of breath at rest or with exertion. Denies wheezing or cough.  GI: Denies dysphagia or odynophagia. Denies jaundice, hematemesis, fecal incontinence. GU : Denies urinary burning, urinary frequency, urinary hesitancy MS: Denies joint pain, muscle weakness, cramps, or limitation of movement.  Derm: Denies rash, itching, dry skin Psych: Denies depression, anxiety, memory loss, and confusion Heme: Denies bruising, bleeding, and enlarged lymph nodes.   Physical Exam   BP 114/74 (BP Location: Left Arm, Patient Position: Sitting, Cuff Size: Large)   Pulse 71   Temp 98.6 F (37 C) (Temporal)   Ht 5\' 9"  (1.753 m)   Wt 229 lb 1.6 oz (103.9 kg)   BMI 33.83 kg/m  General:   Alert and oriented. Pleasant and cooperative. Well-nourished and well-developed.  Head:  Normocephalic and atraumatic. Eyes:  Without icterus Abdomen:  +BS, soft, non-tender and non-distended. No HSM noted. No guarding or rebound. No masses appreciated.  Rectal:  Deferred  Msk:  Symmetrical without gross deformities. Normal posture. Extremities:  Without edema. Neurologic:  Alert and  oriented x4;  grossly normal  neurologically. Skin:  Intact without significant lesions or rashes. Psych:  Alert and cooperative. Normal mood and affect.   Assessment   Ethan Walls is a 62 y.o. male presenting today in follow-up with a history of occult UGI bleed in April 2024, s/p EGD with gastritis but no obvious source of bleeding. Colonoscopy up-to-date as of Nov 2023.  Hgb had drifted slightly during episodes of melena; we will check CBC now today. Unclear etiology of GI bleed but could have been occult Dieulafoy or AVMs. He denies recent NSAID use but did have large amounts in December.   If remains with mild anemia, recommend capsule study to evaluate. If normalized, will follow conservatively and pursue capsule with any recurrent overt bleeding.   Hiccups have improved from previously although continues after eating. He is not following a GERD diet and endorses eating whatever he desires. Likely atypical GERD. No alarm signs/symptoms. Will continue PPI BID for another month, then decrease to once daily.    PLAN    CBC today Need for capsule to be determined after CBC Call if any further overt GI bleeding PPI BID for another 30 days then decrease to once daily 3 month follow-up Continue to avoid NSAIDs   Gelene Mink, PhD, ANP-BC [facility]

## 2023-02-11 NOTE — Patient Instructions (Signed)
Please have blood work done today. If your counts are still low, we will do a capsule study.  If normal, we will continue to monitor!  Take the pantoprazole (Protonix) twice a day for another 30 days, then decrease to once daily.  Call if any further black stool!  We will see you in 3 months!  I enjoyed seeing you again today! I value our relationship and want to provide genuine, compassionate, and quality care. You may receive a survey regarding your visit with me, and I welcome your feedback! Thanks so much for taking the time to complete this. I look forward to seeing you again.      Gelene Mink, PhD, ANP-BC [facility]

## 2023-02-12 LAB — CBC WITH DIFFERENTIAL/PLATELET
Basophils Absolute: 0.1 10*3/uL (ref 0.0–0.2)
Basos: 1 %
EOS (ABSOLUTE): 0.3 10*3/uL (ref 0.0–0.4)
Eos: 5 %
Hematocrit: 38 % (ref 37.5–51.0)
Hemoglobin: 12.8 g/dL — ABNORMAL LOW (ref 13.0–17.7)
Immature Grans (Abs): 0.1 10*3/uL (ref 0.0–0.1)
Immature Granulocytes: 1 %
Lymphocytes Absolute: 1.3 10*3/uL (ref 0.7–3.1)
Lymphs: 19 %
MCH: 29.3 pg (ref 26.6–33.0)
MCHC: 33.7 g/dL (ref 31.5–35.7)
MCV: 87 fL (ref 79–97)
Monocytes Absolute: 0.9 10*3/uL (ref 0.1–0.9)
Monocytes: 13 %
Neutrophils Absolute: 4.1 10*3/uL (ref 1.4–7.0)
Neutrophils: 61 %
Platelets: 243 10*3/uL (ref 150–450)
RBC: 4.37 x10E6/uL (ref 4.14–5.80)
RDW: 13.3 % (ref 11.6–15.4)
WBC: 6.8 10*3/uL (ref 3.4–10.8)

## 2023-02-25 ENCOUNTER — Encounter: Payer: Self-pay | Admitting: *Deleted

## 2023-03-02 ENCOUNTER — Encounter (HOSPITAL_COMMUNITY)
Admission: RE | Disposition: A | Discharge status: Home / Self Care | Payer: Self-pay | Source: Home / Self Care | Attending: Internal Medicine

## 2023-03-02 ENCOUNTER — Ambulatory Visit (HOSPITAL_COMMUNITY)
Admission: RE | Admit: 2023-03-02 | Discharge: 2023-03-02 | Disposition: A | Discharge status: Home / Self Care | Payer: BC Managed Care – PPO | Attending: Internal Medicine | Admitting: Internal Medicine

## 2023-03-02 DIAGNOSIS — D649 Anemia, unspecified: Secondary | ICD-10-CM | POA: Diagnosis not present

## 2023-03-02 DIAGNOSIS — K921 Melena: Secondary | ICD-10-CM | POA: Diagnosis not present

## 2023-03-02 DIAGNOSIS — K222 Esophageal obstruction: Secondary | ICD-10-CM | POA: Insufficient documentation

## 2023-03-02 HISTORY — PX: GIVENS CAPSULE STUDY: SHX5432

## 2023-03-02 SURGERY — IMAGING PROCEDURE, GI TRACT, INTRALUMINAL, VIA CAPSULE
Anesthesia: Monitor Anesthesia Care

## 2023-03-03 ENCOUNTER — Encounter (HOSPITAL_COMMUNITY): Payer: Self-pay | Admitting: Internal Medicine

## 2023-03-04 ENCOUNTER — Telehealth: Payer: Self-pay | Admitting: Gastroenterology

## 2023-03-04 NOTE — Procedures (Signed)
Small Bowel [place] Capsule Study Procedure date:  03/02/23  Referring Provider:  Lewie Loron, NP PCP:  Dr. Tommie Sams, DO  Indication for procedure:    62 y.o. male presenting today in follow-up with a history of new onset melena, slight anemia, heme positive stool in April 2024.  Hgb had drifted from 13.7 to 12.5. EGD completed with hiatal hernia, mild Schatzki ring, gastritis with negative H.pylori. Colonoscopy up-to-date as of Nov 2023 with diverticulosis. Capsule study now to exclude occult small bowel source.    Findings:   Capsule was complete to the cecum. Limited views intermittently due to less than ideal prep. No obvious mass, lesions, AVMs.   First Gastric image:  00:00:55 First Duodenal image: 00:28:15 First Cecal image: 03:54:00 Gastric Passage time: 0h 42m Small Bowel Passage time:  3h 47m  Summary & Recommendations: 62 year old male with new onset melena and slight anemia in April 2024, s/p endoscopy with gastritis, negative H.pylori, and colonoscopy up-to-date as of Nov 2023. No obvious source for occult small bowel etiology; however, small AVMs or lesions could be missed due to obscured view from poor prep. I suspect he may have had stuttering loss from occult AVMs.   If he has persistent anemia, could pursue CTE. If obvious melena, may need to consider repeat EGD with small bowel enteroscopy.   Return in 3 months as planned.

## 2023-03-04 NOTE — Telephone Encounter (Signed)
Dena:  Please tell patient I reviewed capsule study. No obvious bleeding lesions, polyps, mass, etc. However, some of the views were limited due to debris.   If he has any further black stool, call us immediately. We will see him in 3 months!

## 2023-03-08 NOTE — Telephone Encounter (Signed)
Please schedule the pt for a 3 month follow up appt.  Pt called and advised of his result note and the pt expressed understanding. Sent to [facility] to schedule for 3 month appt

## 2023-03-08 NOTE — H&P (Signed)
Patient presenting for Capsule endoscopy for melena and anemia

## 2023-05-04 ENCOUNTER — Ambulatory Visit (HOSPITAL_COMMUNITY)
Admission: RE | Admit: 2023-05-04 | Discharge: 2023-05-04 | Disposition: A | Discharge status: Home / Self Care | Payer: BC Managed Care – PPO | Source: Ambulatory Visit | Attending: Family Medicine | Admitting: Family Medicine

## 2023-05-04 ENCOUNTER — Ambulatory Visit: Payer: BC Managed Care – PPO | Admitting: Family Medicine

## 2023-05-04 VITALS — BP 111/73 | HR 69 | Temp 97.9°F | Ht 69.0 in | Wt 229.8 lb

## 2023-05-04 DIAGNOSIS — I10 Essential (primary) hypertension: Secondary | ICD-10-CM | POA: Diagnosis not present

## 2023-05-04 DIAGNOSIS — Z125 Encounter for screening for malignant neoplasm of prostate: Secondary | ICD-10-CM

## 2023-05-04 DIAGNOSIS — E7800 Pure hypercholesterolemia, unspecified: Secondary | ICD-10-CM | POA: Diagnosis not present

## 2023-05-04 DIAGNOSIS — Z8719 Personal history of other diseases of the digestive system: Secondary | ICD-10-CM | POA: Diagnosis not present

## 2023-05-04 DIAGNOSIS — R051 Acute cough: Secondary | ICD-10-CM

## 2023-05-04 DIAGNOSIS — D649 Anemia, unspecified: Secondary | ICD-10-CM | POA: Diagnosis not present

## 2023-05-04 NOTE — Assessment & Plan Note (Signed)
Chest x-ray obtained today.  Independently reviewed by me.  Normal.

## 2023-05-04 NOTE — Assessment & Plan Note (Signed)
Lipid to reassess.  Continue Crestor.

## 2023-05-04 NOTE — Assessment & Plan Note (Signed)
Stable.  Continue current medications.

## 2023-05-04 NOTE — Progress Notes (Signed)
Subjective:  Patient ID: Ernestene Kiel, male    DOB: March 20, 1961  Age: 62 y.o. MRN: [id number]  CC:  Follow up   HPI:  62 year old male with hypertension, BPH, hyperlipidemia, history of GI bleed presents for follow-up.  Patient reports that he is having respiratory symptoms.  Recent runny nose.  Having cough as well.  He states that he is having some shortness of breath.  He has been using Mucinex.  Patient is concerned about pneumonia given the fact that he had pneumonia earlier in the year in April.  Patient's blood pressure is well-controlled on amlodipine and enalapril.  Lipids have been fairly well-controlled on Crestor.  Needs repeat lipid panel.  Planning for labs today.  Patient Active Problem List   Diagnosis Date Noted   History of GI bleed 05/04/2023   Acute cough 05/04/2023   Chronic pain of left knee 07/30/2022   BPH (benign prostatic hyperplasia) 07/10/2021   Essential hypertension, benign 02/18/2014   Hyperlipidemia 02/18/2014    Social Hx   Social History   Socioeconomic History   Marital status: Married    Spouse name: Not on file   Number of children: Not on file   Years of education: Not on file   Highest education level: 12th grade  Occupational History   Not on file  Tobacco Use   Smoking status: Never   Smokeless tobacco: Never  Vaping Use   Vaping status: Never Used  Substance and Sexual Activity   Alcohol use: No   Drug use: No   Sexual activity: Yes  Other Topics Concern   Not on file  Social History Narrative   Not on file   Social Determinants of Health   Financial Resource Strain: Low Risk  (01/14/2023)   Overall Financial Resource Strain (CARDIA)    Difficulty of Paying Living Expenses: Not hard at all  Food Insecurity: No Food Insecurity (01/14/2023)   Hunger Vital Sign    Worried About Running Out of Food in the Last Year: Never true    Ran Out of Food in the Last Year: Never true  Transportation Needs: No Transportation  Needs (01/14/2023)   PRAPARE - Administrator, Civil Service (Medical): No    Lack of Transportation (Non-Medical): No  Physical Activity: Not on file  Stress: No Stress Concern Present (01/14/2023)   [employer] of Occupational Health - Occupational Stress Questionnaire    Feeling of Stress : Not at all  Social Connections: Unknown (01/14/2023)   Social Connection and Isolation Panel [NHANES]    Frequency of Communication with Friends and Family: More than three times a week    Frequency of Social Gatherings with Friends and Family: Once a week    Attends Religious Services: Patient declined    Database administrator or Organizations: No    Attends Engineer, structural: Not on file    Marital Status: Married    Review of Systems Per HPI  Objective:  BP 111/73   Pulse 69   Temp 97.9 F (36.6 C)   Ht 5\' 9"  (1.753 m)   Wt 229 lb 12.8 oz (104.2 kg)   SpO2 97%   BMI 33.94 kg/m      05/04/2023    8:36 AM 03/02/2023    7:06 AM 02/11/2023   12:15 PM  BP/Weight  Systolic BP 111  621  Diastolic BP 73  74  Wt. (Lbs) 229.8 225 229.1  BMI 33.94 kg/m2 33.23 kg/m2  33.83 kg/m2    Physical Exam Vitals and nursing note reviewed.  Constitutional:      General: He is not in acute distress.    Appearance: Normal appearance.  HENT:     Head: Normocephalic and atraumatic.  Eyes:     General:        Right eye: No discharge.        Left eye: No discharge.     Conjunctiva/sclera: Conjunctivae normal.  Cardiovascular:     Rate and Rhythm: Normal rate and regular rhythm.  Pulmonary:     Effort: Pulmonary effort is normal.     Breath sounds: Normal breath sounds. No wheezing, rhonchi or rales.  Neurological:     Mental Status: He is alert.  Psychiatric:        Mood and Affect: Mood normal.        Behavior: Behavior normal.     Lab Results  Component Value Date   WBC 6.8 02/11/2023   HGB 12.8 (L) 02/11/2023   HCT 38.0 02/11/2023   PLT 243 02/11/2023    GLUCOSE 115 (H) 01/15/2023   CHOL 147 07/29/2022   TRIG 97 07/29/2022   HDL 54 07/29/2022   LDLCALC 75 07/29/2022   ALT 27 01/13/2023   AST 24 01/13/2023   NA 136 01/15/2023   K 3.8 01/15/2023   CL 103 01/15/2023   CREATININE 1.33 (H) 01/15/2023   BUN 15 01/15/2023   CO2 24 01/15/2023   PSA 0.21 02/15/2014     Assessment & Plan:   Problem List Items Addressed This Visit       Cardiovascular and Mediastinum   Essential hypertension, benign - Primary    Stable.  Continue current medications.      Relevant Orders   CMP14+EGFR     Other   Hyperlipidemia    Lipid to reassess.  Continue Crestor.      Relevant Orders   Lipid panel   History of GI bleed   Acute cough    Chest x-ray obtained today.  Independently reviewed by me.  Normal.      Relevant Orders   DG Chest 2 View (Completed)   Other Visit Diagnoses     Anemia, unspecified type       Relevant Orders   CBC   Prostate cancer screening       Relevant Orders   PSA       Follow-up:  3-6 months  Kastiel Simonian Adriana Simas DO [facility]

## 2023-05-04 NOTE — Patient Instructions (Signed)
Labs and xray today.  We will call with results.  Follow up in 3-6 months.

## 2023-05-09 ENCOUNTER — Other Ambulatory Visit: Payer: Self-pay | Admitting: Family Medicine

## 2023-05-11 ENCOUNTER — Other Ambulatory Visit: Payer: Self-pay | Admitting: *Deleted

## 2023-05-11 DIAGNOSIS — I10 Essential (primary) hypertension: Secondary | ICD-10-CM

## 2023-05-11 DIAGNOSIS — R7989 Other specified abnormal findings of blood chemistry: Secondary | ICD-10-CM

## 2023-05-11 MED ORDER — ENALAPRIL MALEATE 20 MG PO TABS
20.0000 mg | ORAL_TABLET | Freq: Every day | ORAL | 0 refills | Status: DC
Start: 2023-05-11 — End: 2023-08-13

## 2023-05-27 ENCOUNTER — Encounter: Payer: Self-pay | Admitting: Internal Medicine

## 2023-06-09 ENCOUNTER — Ambulatory Visit (INDEPENDENT_AMBULATORY_CARE_PROVIDER_SITE_OTHER): Payer: BC Managed Care – PPO

## 2023-06-09 VITALS — BP 128/82 | HR 62

## 2023-06-09 DIAGNOSIS — Z23 Encounter for immunization: Secondary | ICD-10-CM

## 2023-07-20 ENCOUNTER — Encounter: Payer: Self-pay | Admitting: Family Medicine

## 2023-07-27 ENCOUNTER — Other Ambulatory Visit: Payer: Self-pay

## 2023-07-27 DIAGNOSIS — G8929 Other chronic pain: Secondary | ICD-10-CM

## 2023-07-29 ENCOUNTER — Encounter: Payer: Self-pay | Admitting: Family Medicine

## 2023-07-29 ENCOUNTER — Ambulatory Visit: Payer: BC Managed Care – PPO | Admitting: Family Medicine

## 2023-07-29 ENCOUNTER — Ambulatory Visit (HOSPITAL_COMMUNITY)
Admission: RE | Admit: 2023-07-29 | Discharge: 2023-07-29 | Disposition: A | Discharge status: Home / Self Care | Payer: BC Managed Care – PPO | Source: Ambulatory Visit | Attending: Family Medicine | Admitting: Family Medicine

## 2023-07-29 VITALS — BP 129/70 | HR 72 | Temp 98.4°F | Ht 69.0 in | Wt 232.0 lb

## 2023-07-29 DIAGNOSIS — G8929 Other chronic pain: Secondary | ICD-10-CM | POA: Diagnosis present

## 2023-07-29 DIAGNOSIS — M25562 Pain in left knee: Secondary | ICD-10-CM

## 2023-07-29 MED ORDER — TRAMADOL HCL 50 MG PO TABS: 50.0000 mg | ORAL_TABLET | Freq: Three times a day (TID) | ORAL | 0 refills | Status: DC | PRN

## 2023-07-29 NOTE — Progress Notes (Signed)
Subjective:  Patient ID: Ethan Walls, male    DOB: 1961/02/04  Age: 62 y.o. MRN: [id number]  CC:   Chief Complaint  Patient presents with   left knee pain    Aleve , advil and tylenol  Goin on x 1 month , no known injury lately, but 1.5 yr ago near fall twisted knee left    HPI:  62 year old male presents with left knee pain.  Worsening over the past month. Pain is located medial. Worse with certain movements like crossing his legs or internal & external rotation. No relief with OTC NSAID's and Tylenol. Advised patient to avoid NSAID's given prior GI bleed. Has not yet seen ortho. Referral recently placed. Prior xray revealed mild arthritis.  Patient Active Problem List   Diagnosis Date Noted   History of GI bleed 05/04/2023   Chronic pain of left knee 07/30/2022   BPH (benign prostatic hyperplasia) 07/10/2021   Essential hypertension, benign 02/18/2014   Hyperlipidemia 02/18/2014    Social Hx   Social History   Socioeconomic History   Marital status: Married    Spouse name: Not on file   Number of children: Not on file   Years of education: Not on file   Highest education level: 12th grade  Occupational History   Not on file  Tobacco Use   Smoking status: Never   Smokeless tobacco: Never  Vaping Use   Vaping status: Never Used  Substance and Sexual Activity   Alcohol use: No   Drug use: No   Sexual activity: Yes  Other Topics Concern   Not on file  Social History Narrative   Not on file   Social Determinants of Health   Financial Resource Strain: Low Risk  (07/28/2023)   Overall Financial Resource Strain (CARDIA)    Difficulty of Paying Living Expenses: Not very hard  Food Insecurity: No Food Insecurity (07/28/2023)   Hunger Vital Sign    Worried About Running Out of Food in the Last Year: Never true    Ran Out of Food in the Last Year: Never true  Transportation Needs: No Transportation Needs (07/28/2023)   PRAPARE - Scientist, research (physical sciences) (Medical): No    Lack of Transportation (Non-Medical): No  Physical Activity: Unknown (07/28/2023)   Exercise Vital Sign    Days of Exercise per Week: 0 days    Minutes of Exercise per Session: Not on file  Stress: No Stress Concern Present (07/28/2023)   [employer] of Occupational Health - Occupational Stress Questionnaire    Feeling of Stress : Not at all  Social Connections: Moderately Integrated (07/28/2023)   Social Connection and Isolation Panel [NHANES]    Frequency of Communication with Friends and Family: More than three times a week    Frequency of Social Gatherings with Friends and Family: Three times a week    Attends Religious Services: 1 to 4 times per year    Active Member of Clubs or Organizations: No    Attends Engineer, structural: Not on file    Marital Status: Married    Review of Systems Per HPI  Objective:  BP 129/70   Pulse 72   Temp 98.4 F (36.9 C)   Ht 5\' 9"  (1.753 m)   Wt 232 lb (105.2 kg)   SpO2 97%   BMI 34.26 kg/m      07/29/2023    3:34 PM 06/09/2023    8:31 AM 05/04/2023    8:36  AM  BP/Weight  Systolic BP 129 128 111  Diastolic BP 70 82 73  Wt. (Lbs) 232  229.8  BMI 34.26 kg/m2  33.94 kg/m2    Physical Exam Constitutional:      General: He is not in acute distress.    Appearance: Normal appearance.  HENT:     Head: Normocephalic and atraumatic.  Musculoskeletal:     Comments: Left knee - no appreciable effusion. No joint line tenderness. No crepitus. Ligaments intact.  Neurological:     Mental Status: He is alert.     Lab Results  Component Value Date   WBC 8.5 05/04/2023   HGB 14.8 05/04/2023   HCT 43.1 05/04/2023   PLT 246 05/04/2023   GLUCOSE 106 (H) 05/04/2023   CHOL 135 05/04/2023   TRIG 75 05/04/2023   HDL 44 05/04/2023   LDLCALC 76 05/04/2023   ALT 37 05/04/2023   AST 27 05/04/2023   NA 142 05/04/2023   K 4.9 05/04/2023   CL 104 05/04/2023   CREATININE 1.45 (H) 05/04/2023    BUN 20 05/04/2023   CO2 24 05/04/2023   PSA 0.21 02/15/2014     Assessment & Plan:   Problem List Items Addressed This Visit       Other   Chronic pain of left knee - Primary    Xray obtained.  Independent interpretation: No fracture. No significant degenerative changes. Tramadol as needed. # given for Ortho to call and schedule appt.      Relevant Medications   traMADol (ULTRAM) 50 MG tablet   Other Relevant Orders   DG Knee Complete 4 Views Left (Completed)    Meds ordered this encounter  Medications   traMADol (ULTRAM) 50 MG tablet    Sig: Take 1 tablet (50 mg total) by mouth every 8 (eight) hours as needed for severe pain (pain score 7-10) or moderate pain (pain score 4-6).    Dispense:  15 tablet    Refill:  0    Kiaja Shorty DO [facility]

## 2023-07-29 NOTE — Patient Instructions (Addendum)
Xray ordered.  Medication as prescribed. No NSAID's.  Referral was just sent yesterday. Call [phone] to schedule.

## 2023-07-29 NOTE — Assessment & Plan Note (Signed)
Xray obtained.  Independent interpretation: No fracture. No significant degenerative changes. Tramadol as needed. # given for Ortho to call and schedule appt.

## 2023-08-13 ENCOUNTER — Other Ambulatory Visit: Payer: Self-pay | Admitting: Family Medicine

## 2023-08-13 DIAGNOSIS — I10 Essential (primary) hypertension: Secondary | ICD-10-CM

## 2023-09-03 ENCOUNTER — Other Ambulatory Visit: Payer: Self-pay | Admitting: Family Medicine

## 2023-10-25 ENCOUNTER — Other Ambulatory Visit: Payer: Self-pay | Admitting: Family Medicine

## 2023-10-25 DIAGNOSIS — I10 Essential (primary) hypertension: Secondary | ICD-10-CM

## 2023-11-04 ENCOUNTER — Encounter: Payer: Self-pay | Admitting: Physician Assistant

## 2023-11-04 ENCOUNTER — Ambulatory Visit: Payer: BC Managed Care – PPO | Admitting: Physician Assistant

## 2023-11-04 ENCOUNTER — Ambulatory Visit: Payer: BC Managed Care – PPO | Admitting: Family Medicine

## 2023-11-04 VITALS — BP 118/72 | HR 74 | Temp 99.1°F | Ht 69.0 in | Wt 224.2 lb

## 2023-11-04 DIAGNOSIS — M25562 Pain in left knee: Secondary | ICD-10-CM

## 2023-11-04 DIAGNOSIS — G8929 Other chronic pain: Secondary | ICD-10-CM | POA: Diagnosis not present

## 2023-11-04 DIAGNOSIS — E7800 Pure hypercholesterolemia, unspecified: Secondary | ICD-10-CM | POA: Diagnosis not present

## 2023-11-04 DIAGNOSIS — I10 Essential (primary) hypertension: Secondary | ICD-10-CM

## 2023-11-04 NOTE — Progress Notes (Signed)
   Established Patient Office Visit  Subjective   Patient ID: Ethan Walls, male    DOB: 1961/09/03  Age: 63 y.o. MRN: [id number]  Chief Complaint  Patient presents with   Hypertension    Patient presents today for follow up regarding hypertension, hyperlipidemia, and knee pain. Patient states daily compliance with medication and denies concerns for adverse effects. Patient denies new onset headaches or visual changes. Patient reports knee pain is significantly improved since last visit. He states he is no longer taking Tramadol  for pain control. Patient with MRI result note today.     Review of Systems  Constitutional:  Negative for chills and fever.  Eyes:  Negative for blurred vision and double vision.  Respiratory:  Negative for shortness of breath.   Cardiovascular:  Negative for chest pain and palpitations.  Gastrointestinal:  Negative for nausea and vomiting.  Neurological:  Negative for dizziness and headaches.      Objective:     BP 118/72   Pulse 74   Temp 99.1 F (37.3 C)   Ht 5' 9 (1.753 m)   Wt 224 lb 3.2 oz (101.7 kg)   SpO2 95%   BMI 33.11 kg/m    Physical Exam Constitutional:      Appearance: Normal appearance. He is obese.  HENT:     Head: Normocephalic.     Mouth/Throat:     Mouth: Mucous membranes are moist.     Pharynx: Oropharynx is clear.  Eyes:     Extraocular Movements: Extraocular movements intact.     Conjunctiva/sclera: Conjunctivae normal.  Cardiovascular:     Rate and Rhythm: Normal rate and regular rhythm.     Heart sounds: No murmur heard.    No gallop.  Pulmonary:     Effort: Pulmonary effort is normal.     Breath sounds: No wheezing, rhonchi or rales.  Musculoskeletal:     Right lower leg: No edema.     Left lower leg: No edema.  Skin:    General: Skin is warm and dry.  Neurological:     General: No focal deficit present.     Mental Status: He is alert and oriented to person, place, and time.  Psychiatric:         Mood and Affect: Mood normal.        Behavior: Behavior normal.     No results found for any visits on 11/04/23.  The 10-year ASCVD risk score (Arnett DK, et al., 2019) is: 9.7%    Assessment & Plan:   Return in about 6 months (around 05/03/2024).   Essential hypertension, benign Assessment & Plan: 118/72. Controlled. Continue current medications. No change in management. Increase dietary efforts and physical activity. CMP today.   Orders: -     CMP14+EGFR  Pure hypercholesterolemia Assessment & Plan: Stable. Continue with current management without changes. Discussed healthy diet and lifestyle.   Orders: -     CMP14+EGFR  Chronic pain of left knee Assessment & Plan: Improving. Patient reporting decreased pain and increased ROM. No significant swelling today. Recent MRI showed medial meniscal tear but otherwise unremarkable. Patient to continue following with ortho as needed.      Charmaine Renai Lopata, PA-C

## 2023-11-04 NOTE — Assessment & Plan Note (Addendum)
 118/72. Controlled. Continue current medications. No change in management. Increase dietary efforts and physical activity. CMP today.

## 2023-11-04 NOTE — Assessment & Plan Note (Signed)
 Stable. Continue with current management without changes. Discussed healthy diet and lifestyle.

## 2023-11-04 NOTE — Assessment & Plan Note (Signed)
 Improving. Patient reporting decreased pain and increased ROM. No significant swelling today. Recent MRI showed medial meniscal tear but otherwise unremarkable. Patient to continue following with ortho as needed.

## 2023-11-05 ENCOUNTER — Encounter: Payer: Self-pay | Admitting: Physician Assistant

## 2023-11-05 LAB — CMP14+EGFR
ALT: 44 [IU]/L (ref 0–44)
AST: 34 [IU]/L (ref 0–40)
Albumin: 4.5 g/dL (ref 3.9–4.9)
Alkaline Phosphatase: 78 [IU]/L (ref 44–121)
BUN/Creatinine Ratio: 13 (ref 10–24)
BUN: 19 mg/dL (ref 8–27)
Bilirubin Total: 0.5 mg/dL (ref 0.0–1.2)
CO2: 22 mmol/L (ref 20–29)
Calcium: 9.6 mg/dL (ref 8.6–10.2)
Chloride: 104 mmol/L (ref 96–106)
Creatinine, Ser: 1.44 mg/dL — ABNORMAL HIGH (ref 0.76–1.27)
Globulin, Total: 2.4 g/dL (ref 1.5–4.5)
Glucose: 100 mg/dL — ABNORMAL HIGH (ref 70–99)
Potassium: 4.5 mmol/L (ref 3.5–5.2)
Sodium: 142 mmol/L (ref 134–144)
Total Protein: 6.9 g/dL (ref 6.0–8.5)
eGFR: 54 mL/min/{1.73_m2} — ABNORMAL LOW (ref >59)

## 2024-01-27 ENCOUNTER — Other Ambulatory Visit: Payer: Self-pay | Admitting: Family Medicine

## 2024-01-28 ENCOUNTER — Other Ambulatory Visit: Payer: Self-pay

## 2024-01-28 MED ORDER — AMLODIPINE BESYLATE 10 MG PO TABS: 10.0000 mg | ORAL_TABLET | Freq: Every day | ORAL | 1 refills | Status: DC

## 2024-02-20 ENCOUNTER — Other Ambulatory Visit: Payer: Self-pay | Admitting: Gastroenterology

## 2024-05-03 ENCOUNTER — Encounter: Payer: Self-pay | Admitting: Family Medicine

## 2024-05-03 ENCOUNTER — Ambulatory Visit: Payer: BC Managed Care – PPO | Admitting: Family Medicine

## 2024-05-03 DIAGNOSIS — N1831 Chronic kidney disease, stage 3a: Secondary | ICD-10-CM

## 2024-05-03 DIAGNOSIS — Z23 Encounter for immunization: Secondary | ICD-10-CM

## 2024-05-03 DIAGNOSIS — N183 Chronic kidney disease, stage 3 unspecified: Secondary | ICD-10-CM | POA: Insufficient documentation

## 2024-05-03 DIAGNOSIS — K219 Gastro-esophageal reflux disease without esophagitis: Secondary | ICD-10-CM | POA: Diagnosis not present

## 2024-05-03 DIAGNOSIS — I10 Essential (primary) hypertension: Secondary | ICD-10-CM

## 2024-05-03 DIAGNOSIS — E7800 Pure hypercholesterolemia, unspecified: Secondary | ICD-10-CM

## 2024-05-03 DIAGNOSIS — Z125 Encounter for screening for malignant neoplasm of prostate: Secondary | ICD-10-CM

## 2024-05-03 MED ORDER — AMLODIPINE BESYLATE 10 MG PO TABS: 10.0000 mg | ORAL_TABLET | Freq: Every day | ORAL | 1 refills | Status: DC

## 2024-05-03 MED ORDER — ENALAPRIL MALEATE 20 MG PO TABS: 20.0000 mg | ORAL_TABLET | Freq: Every day | ORAL | 0 refills | Status: DC

## 2024-05-03 MED ORDER — ROSUVASTATIN CALCIUM 10 MG PO TABS: 10.0000 mg | ORAL_TABLET | Freq: Every day | ORAL | 3 refills | Status: DC

## 2024-05-03 MED ORDER — DEXLANSOPRAZOLE 30 MG PO CPDR: 30.0000 mg | DELAYED_RELEASE_CAPSULE | Freq: Every day | ORAL | 3 refills | Status: DC

## 2024-05-03 NOTE — Patient Instructions (Signed)
 Labs today.  Medications sent in.  Follow up 3-6 months. If GERD continues to be problematic, we need to get you back in with GI.  Take care  Dr. Bluford

## 2024-05-03 NOTE — Assessment & Plan Note (Signed)
Stable. Continue medications

## 2024-05-03 NOTE — Assessment & Plan Note (Addendum)
 Metabolic panel today to assess stability.

## 2024-05-03 NOTE — Progress Notes (Signed)
 Subjective:  Patient ID: Ethan Walls, male    DOB: [birth year]  Age: 63 y.o. MRN: [id number]  CC:   Chief Complaint  Patient presents with   Follow-up    Hypertension, hyperlipidemia    HPI:  63 year old male presents for follow-up.  Patient reports that he has had some upper back pain.  Appears to be muscular.  Recently worsened by pulling on a lawn more.  Patient reports that he is having significant reflux.  He states this occurs quite a lot.  Occurs with most meals.  Worse by [place] food.  He is not on any treatment for this at this time.  He states that medication ran out and he did not continue.  He has a history of GI bleed.  Endoscopy was done in April.  Patient's blood pressure well-controlled on amlodipine  and enalapril .  Lipids have been fairly well-controlled.  Needs recheck.  Currently on Crestor  and tolerating.  Patient is amenable to pneumococcal vaccine today.  Patient Active Problem List   Diagnosis Date Noted   CKD (chronic kidney disease) stage 3, GFR 30-59 ml/min (HCC) 05/03/2024   GERD (gastroesophageal reflux disease) 05/03/2024   History of GI bleed 05/04/2023   Chronic pain of left knee 07/30/2022   BPH (benign prostatic hyperplasia) 07/10/2021   Essential hypertension, benign 02/18/2014   Hyperlipidemia 02/18/2014    Social Hx   Social History   Socioeconomic History   Marital status: Married    Spouse name: Not on file   Number of children: Not on file   Years of education: Not on file   Highest education level: 12th grade  Occupational History   Not on file  Tobacco Use   Smoking status: Never   Smokeless tobacco: Never  Vaping Use   Vaping status: Never Used  Substance and Sexual Activity   Alcohol use: No   Drug use: No   Sexual activity: Yes  Other Topics Concern   Not on file  Social History Narrative   Not on file   Social Drivers of Health   Financial Resource Strain: Low Risk  (05/02/2024)   Overall Financial  Resource Strain (CARDIA)    Difficulty of Paying Living Expenses: Not hard at all  Food Insecurity: No Food Insecurity (05/02/2024)   Hunger Vital Sign    Worried About Running Out of Food in the Last Year: Never true    Ran Out of Food in the Last Year: Never true  Transportation Needs: No Transportation Needs (05/02/2024)   PRAPARE - Administrator, Civil Service (Medical): No    Lack of Transportation (Non-Medical): No  Physical Activity: Unknown (07/28/2023)   Exercise Vital Sign    Days of Exercise per Week: 0 days    Minutes of Exercise per Session: Not on file  Stress: No Stress Concern Present (07/28/2023)   [employer] of Occupational Health - Occupational Stress Questionnaire    Feeling of Stress : Not at all  Social Connections: Moderately Integrated (05/02/2024)   Social Connection and Isolation Panel    Frequency of Communication with Friends and Family: More than three times a week    Frequency of Social Gatherings with Friends and Family: Once a week    Attends Religious Services: 1 to 4 times per year    Active Member of [employer] or Organizations: No    Attends Engineer, structural: Not on file    Marital Status: Married    Review of Systems Per  HPI  Objective:  BP 122/81   Pulse 60   Temp 97.9 F (36.6 C)   Ht 5' 9 (1.753 m)   Wt 224 lb (101.6 kg)   SpO2 98%   BMI 33.08 kg/m      05/03/2024    8:33 AM 11/04/2023    8:38 AM 07/29/2023    3:34 PM  BP/Weight  Systolic BP 122 118 129  Diastolic BP 81 72 70  Wt. (Lbs) 224 224.2 232  BMI 33.08 kg/m2 33.11 kg/m2 34.26 kg/m2    Physical Exam Vitals and nursing note reviewed.  Constitutional:      General: He is not in acute distress.    Appearance: Normal appearance.  HENT:     Head: Normocephalic and atraumatic.  Eyes:     General:        Right eye: No discharge.        Left eye: No discharge.     Conjunctiva/sclera: Conjunctivae normal.  Neck:     Comments: Tightness of the  trapezius muscles bilaterally. Cardiovascular:     Rate and Rhythm: Normal rate and regular rhythm.  Pulmonary:     Effort: Pulmonary effort is normal.     Breath sounds: Normal breath sounds. No wheezing, rhonchi or rales.  Neurological:     Mental Status: He is alert.  Psychiatric:        Mood and Affect: Mood normal.        Behavior: Behavior normal.     Lab Results  Component Value Date   WBC 8.5 05/04/2023   HGB 14.8 05/04/2023   HCT 43.1 05/04/2023   PLT 246 05/04/2023   GLUCOSE 100 (H) 11/04/2023   CHOL 135 05/04/2023   TRIG 75 05/04/2023   HDL 44 05/04/2023   LDLCALC 76 05/04/2023   ALT 44 11/04/2023   AST 34 11/04/2023   NA 142 11/04/2023   K 4.5 11/04/2023   CL 104 11/04/2023   CREATININE 1.44 (H) 11/04/2023   BUN 19 11/04/2023   CO2 22 11/04/2023   PSA 0.21 02/15/2014     Assessment & Plan:  Stage 3a chronic kidney disease (HCC) Assessment & Plan: Metabolic panel today to assess stability.  Orders: -     CBC -     CMP14+EGFR -     Microalbumin / creatinine urine ratio  Essential hypertension, benign Assessment & Plan: Stable.  Continue medications.  Orders: -     Enalapril  Maleate; Take 1 tablet (20 mg total) by mouth daily.  Dispense: 90 tablet; Refill: 0 -     amLODIPine  Besylate; Take 1 tablet (10 mg total) by mouth daily.  Dispense: 90 tablet; Refill: 1  Pure hypercholesterolemia Assessment & Plan: Has been stable.  Lipid panel today.  Continue Crestor .  Orders: -     Rosuvastatin  Calcium ; Take 1 tablet (10 mg total) by mouth daily.  Dispense: 90 tablet; Refill: 3 -     Lipid panel  Prostate cancer screening -     PSA  Immunization due -     Pneumococcal conjugate vaccine 20-valent  Gastroesophageal reflux disease, unspecified whether esophagitis present Assessment & Plan: Persistent. Starting Dexilant .  Orders: -     Dexlansoprazole ; Take 1 capsule (30 mg total) by mouth daily.  Dispense: 30 capsule; Refill: 3    Follow-up:  3 to 6 months.  Sooner if GERD remains persistent/refractory.  Jacqulyn Ahle DO [facility]

## 2024-05-03 NOTE — Assessment & Plan Note (Signed)
 Has been stable.  Lipid panel today.  Continue Crestor .

## 2024-05-03 NOTE — Assessment & Plan Note (Signed)
 Persistent. Starting Dexilant .

## 2024-05-04 ENCOUNTER — Ambulatory Visit: Payer: Self-pay | Admitting: Family Medicine

## 2024-05-04 LAB — PSA: Prostate Specific Ag, Serum: 0.2 ng/mL (ref 0.0–4.0)

## 2024-05-04 LAB — CMP14+EGFR
ALT: 48 IU/L — ABNORMAL HIGH (ref 0–44)
AST: 36 IU/L (ref 0–40)
Albumin: 4.7 g/dL (ref 3.9–4.9)
Alkaline Phosphatase: 75 IU/L (ref 44–121)
BUN/Creatinine Ratio: 13 (ref 10–24)
BUN: 19 mg/dL (ref 8–27)
Bilirubin Total: 0.4 mg/dL (ref 0.0–1.2)
CO2: 21 mmol/L (ref 20–29)
Calcium: 9.8 mg/dL (ref 8.6–10.2)
Chloride: 102 mmol/L (ref 96–106)
Creatinine, Ser: 1.41 mg/dL — ABNORMAL HIGH (ref 0.76–1.27)
Globulin, Total: 2.7 g/dL (ref 1.5–4.5)
Glucose: 100 mg/dL — ABNORMAL HIGH (ref 70–99)
Potassium: 5.1 mmol/L (ref 3.5–5.2)
Sodium: 141 mmol/L (ref 134–144)
Total Protein: 7.4 g/dL (ref 6.0–8.5)
eGFR: 55 mL/min/1.73 — ABNORMAL LOW (ref >59)

## 2024-05-04 LAB — CBC
Hematocrit: 44.3 % (ref 37.5–51.0)
Hemoglobin: 14.8 g/dL (ref 13.0–17.7)
MCH: 28.9 pg (ref 26.6–33.0)
MCHC: 33.4 g/dL (ref 31.5–35.7)
MCV: 87 fL (ref 79–97)
Platelets: 282 x10E3/uL (ref 150–450)
RBC: 5.12 x10E6/uL (ref 4.14–5.80)
RDW: 13.6 % (ref 11.6–15.4)
WBC: 7.1 x10E3/uL (ref 3.4–10.8)

## 2024-05-04 LAB — LIPID PANEL
Chol/HDL Ratio: 3.5 ratio (ref 0.0–5.0)
Cholesterol, Total: 157 mg/dL (ref 100–199)
HDL: 45 mg/dL (ref >39)
LDL Chol Calc (NIH): 89 mg/dL (ref 0–99)
Triglycerides: 130 mg/dL (ref 0–149)
VLDL Cholesterol Cal: 23 mg/dL (ref 5–40)

## 2024-05-04 LAB — MICROALBUMIN / CREATININE URINE RATIO
Creatinine, Urine: 409.2 mg/dL
Microalb/Creat Ratio: 10 mg/g{creat} (ref 0–29)
Microalbumin, Urine: 41.5 ug/mL

## 2024-07-26 ENCOUNTER — Other Ambulatory Visit: Payer: Self-pay | Admitting: Family Medicine

## 2024-07-26 DIAGNOSIS — K219 Gastro-esophageal reflux disease without esophagitis: Secondary | ICD-10-CM

## 2024-07-31 ENCOUNTER — Other Ambulatory Visit: Payer: Self-pay | Admitting: Family Medicine

## 2024-07-31 DIAGNOSIS — I10 Essential (primary) hypertension: Secondary | ICD-10-CM

## 2024-08-08 ENCOUNTER — Ambulatory Visit: Admitting: Nurse Practitioner

## 2024-08-08 VITALS — BP 119/71 | Temp 97.3°F | Ht 69.0 in | Wt 227.0 lb

## 2024-08-08 DIAGNOSIS — B9689 Other specified bacterial agents as the cause of diseases classified elsewhere: Secondary | ICD-10-CM | POA: Diagnosis not present

## 2024-08-08 DIAGNOSIS — J069 Acute upper respiratory infection, unspecified: Secondary | ICD-10-CM | POA: Diagnosis not present

## 2024-08-08 MED ORDER — AMOXICILLIN-POT CLAVULANATE 875-125 MG PO TABS: 1.0000 | ORAL_TABLET | Freq: Two times a day (BID) | ORAL | 0 refills | Status: DC

## 2024-08-08 NOTE — Patient Instructions (Signed)
 Use Flonase or Nasacort  nasal spray as directed for head congestion Use Mucinex DM as directed for cough and congestion

## 2024-08-11 ENCOUNTER — Encounter: Payer: Self-pay | Admitting: Nurse Practitioner

## 2024-08-11 NOTE — Progress Notes (Signed)
 Subjective:    Patient ID: Ethan Walls, male    DOB: June 21, 1961, 63 y.o.   MRN: [id number]  Cough   Discussed the use of AI scribe software for clinical note transcription with the patient, who gave verbal consent to proceed.  History of Present Illness Ethan Walls is a 63 year old male who presents with a cough.  He has had a cough since Thursday or Friday of last week, which has improved somewhat but was particularly severe in the last couple of days with significant mucus production. He has been expectorating green or yellow phlegm and has also experienced nasal discharge, which was watery and clear just before the visit. The cough is more pronounced during the daytime, and he has been staying indoors to avoid exacerbating his symptoms.  He has been taking Mucinex since Saturday, which he reports has thinned the mucus. He has not tried any other medications for his symptoms. No fever, ear pain, sore throat, wheezing, chest pain, or unusual shortness of breath. He mentions experiencing some discomfort under his rib from coughing, which was particularly bad yesterday evening. He also describes a sensation of postnasal drip, which he believes contributes to his cough.  He has a history of acid reflux, which has been well-controlled with a medication that he takes in the morning. He used to have reflux problems nearly every night, but this has improved significantly with the current medication regimen.  He denies smoking or vaping and is not aware of any allergies to medications.        Objective:   Physical Exam Vitals and nursing note reviewed.  Constitutional:      General: He is not in acute distress. HENT:     Ears:     Comments: TMs very retracted, no erythema.     Mouth/Throat:     Mouth: Mucous membranes are moist.     Comments: Pharynx mildly injected with PND noted.  Neck:     Comments: Mild anterior cervical adenopathy. Cardiovascular:     Rate and Rhythm:  Normal rate and regular rhythm.  Pulmonary:     Effort: Pulmonary effort is normal.     Breath sounds: Normal breath sounds.  Musculoskeletal:     Cervical back: Neck supple.  Lymphadenopathy:     Cervical: Cervical adenopathy present.  Skin:    General: Skin is warm and dry.  Neurological:     Mental Status: He is alert and oriented to person, place, and time.  Psychiatric:        Mood and Affect: Mood normal.        Behavior: Behavior normal.        Thought Content: Thought content normal.    Today's Vitals   08/08/24 1509  BP: 119/71  Temp: (!) 97.3 F (36.3 C)  SpO2: 97%  Weight: 227 lb (103 kg)  Height: 5' 9 (1.753 m)   Body mass index is 33.52 kg/m.         Assessment & Plan:  1. Bacterial URI (Primary) Acute upper respiratory infection with sinus congestion and productive cough Acute upper respiratory infection with sinus congestion and productive cough, likely exacerbated by recent cold weather. Symptoms include green-yellow mucus production, sinus pressure, and daytime cough. Decision to start antibiotics due to worsening symptoms and colored mucus. - Prescribed antibiotics. - Recommended Mucinex DM for daytime cough, with caution regarding potential effects on blood pressure and heart rate. - Advised use of steroid nasal spray (Flonase or  Nasacort ) with instructions on proper administration. - Instructed to monitor for fever, worsening chest pain, increased shortness of breath, or wheezing, contact office or go to UC - amoxicillin -clavulanate (AUGMENTIN ) 875-125 MG tablet; Take 1 tablet by mouth 2 (two) times daily.  Dispense: 14 tablet; Refill: 0   Return if symptoms worsen or fail to improve.

## 2024-10-13 ENCOUNTER — Other Ambulatory Visit: Payer: Self-pay | Admitting: Family Medicine

## 2024-10-13 DIAGNOSIS — E7800 Pure hypercholesterolemia, unspecified: Secondary | ICD-10-CM

## 2024-10-27 ENCOUNTER — Other Ambulatory Visit: Payer: Self-pay | Admitting: Family Medicine

## 2024-10-27 DIAGNOSIS — I10 Essential (primary) hypertension: Secondary | ICD-10-CM

## 2024-11-02 ENCOUNTER — Ambulatory Visit: Admitting: Family Medicine

## 2024-11-02 VITALS — BP 118/72 | HR 69 | Temp 98.1°F | Ht 69.0 in | Wt 228.0 lb

## 2024-11-02 DIAGNOSIS — E7800 Pure hypercholesterolemia, unspecified: Secondary | ICD-10-CM

## 2024-11-02 DIAGNOSIS — I10 Essential (primary) hypertension: Secondary | ICD-10-CM

## 2024-11-02 DIAGNOSIS — N1831 Chronic kidney disease, stage 3a: Secondary | ICD-10-CM

## 2024-11-02 DIAGNOSIS — K219 Gastro-esophageal reflux disease without esophagitis: Secondary | ICD-10-CM

## 2024-11-02 MED ORDER — DEXLANSOPRAZOLE 30 MG PO CPDR: 30.0000 mg | DELAYED_RELEASE_CAPSULE | Freq: Every day | ORAL | 3 refills | Status: AC

## 2024-11-02 MED ORDER — AMLODIPINE BESYLATE 10 MG PO TABS: 10.0000 mg | ORAL_TABLET | Freq: Every day | ORAL | 3 refills | Status: AC

## 2024-11-02 MED ORDER — ENALAPRIL MALEATE 20 MG PO TABS: 20.0000 mg | ORAL_TABLET | Freq: Every day | ORAL | 3 refills | Status: AC

## 2024-11-02 NOTE — Assessment & Plan Note (Signed)
Stable.  Continue current medications.  Medications refilled.

## 2024-11-02 NOTE — Assessment & Plan Note (Signed)
 Stable.  Continue Crestor .  Refilled.

## 2024-11-02 NOTE — Assessment & Plan Note (Signed)
 Stable. Metabolic panel today.

## 2024-11-02 NOTE — Assessment & Plan Note (Signed)
Stable.  Continue Dexilant.

## 2024-11-02 NOTE — Progress Notes (Signed)
 "  Subjective:  Patient ID: Ethan Walls, male    DOB: [birth year]  Age: 64 y.o. MRN: [id number]  CC:   Chief Complaint  Patient presents with   Follow-up    Pt stated no new concerns     HPI:  64 year old male who presents for follow-up.  Patient states that he is doing well.  No chest pain or shortness of breath.  No abdominal pain.  Hypertension stable on amlodipine  and enalapril .  Chronic disease has been stable.  Needs metabolic panel today.  Lipids have been fairly well-controlled.  He is tolerating Crestor .  Needs lipid panel.  Patient's preventative healthcare items have been updated.  He can schedule Medicare wellness.  Patient Active Problem List   Diagnosis Date Noted   CKD (chronic kidney disease) stage 3, GFR 30-59 ml/min (HCC) 05/03/2024   GERD (gastroesophageal reflux disease) 05/03/2024   History of GI bleed 05/04/2023   Chronic pain of left knee 07/30/2022   BPH (benign prostatic hyperplasia) 07/10/2021   Essential hypertension, benign 02/18/2014   Hyperlipidemia 02/18/2014    Social Hx   Social History   Socioeconomic History   Marital status: Married    Spouse name: Not on file   Number of children: Not on file   Years of education: Not on file   Highest education level: 12th grade  Occupational History   Not on file  Tobacco Use   Smoking status: Never   Smokeless tobacco: Never  Vaping Use   Vaping status: Never Used  Substance and Sexual Activity   Alcohol use: No   Drug use: No   Sexual activity: Yes  Other Topics Concern   Not on file  Social History Narrative   Not on file   Social Drivers of Health   Tobacco Use: Low Risk (08/11/2024)   Patient History    Smoking Tobacco Use: Never    Smokeless Tobacco Use: Never    Passive Exposure: Not on file  Financial Resource Strain: Low Risk (08/08/2024)   Overall Financial Resource Strain (CARDIA)    Difficulty of Paying Living Expenses: Not hard at all  Food Insecurity: No Food  Insecurity (08/08/2024)   Epic    Worried About Programme Researcher, Broadcasting/film/video in the Last Year: Never true    Ran Out of Food in the Last Year: Never true  Transportation Needs: No Transportation Needs (08/08/2024)   Epic    Lack of Transportation (Medical): No    Lack of Transportation (Non-Medical): No  Physical Activity: Unknown (07/28/2023)   Exercise Vital Sign    Days of Exercise per Week: 0 days    Minutes of Exercise per Session: Not on file  Stress: No Stress Concern Present (07/28/2023)   [employer] of Occupational Health - Occupational Stress Questionnaire    Feeling of Stress : Not at all  Social Connections: Moderately Integrated (05/02/2024)   Social Connection and Isolation Panel    Frequency of Communication with Friends and Family: More than three times a week    Frequency of Social Gatherings with Friends and Family: Once a week    Attends Religious Services: 1 to 4 times per year    Active Member of [employer] or Organizations: No    Attends Banker Meetings: Not on file    Marital Status: Married  Depression (PHQ2-9): Low Risk (11/02/2024)   Depression (PHQ2-9)    PHQ-2 Score: 1  Alcohol Screen: Not on file  Housing: Unknown (08/08/2024)   Epic  Unable to Pay for Housing in the Last Year: No    Number of Times Moved in the Last Year: Not on file    Homeless in the Last Year: No  Utilities: Not on file  Health Literacy: Not on file    Review of Systems Per HPI  Objective:  BP 118/72   Pulse 69   Temp 98.1 F (36.7 C)   Ht 5' 9 (1.753 m)   Wt 228 lb (103.4 kg)   SpO2 96%   BMI 33.67 kg/m      11/02/2024    8:08 AM 08/08/2024    3:09 PM 05/03/2024    8:33 AM  BP/Weight  Systolic BP 118 119 122  Diastolic BP 72 71 81  Wt. (Lbs) 228 227 224  BMI 33.67 kg/m2 33.52 kg/m2 33.08 kg/m2    Physical Exam Vitals and nursing note reviewed.  Constitutional:      General: He is not in acute distress.    Appearance: Normal appearance.  HENT:      Head: Normocephalic and atraumatic.  Eyes:     General:        Right eye: No discharge.        Left eye: No discharge.     Conjunctiva/sclera: Conjunctivae normal.  Cardiovascular:     Rate and Rhythm: Normal rate and regular rhythm.  Pulmonary:     Effort: Pulmonary effort is normal.     Breath sounds: Normal breath sounds. No wheezing, rhonchi or rales.  Neurological:     Mental Status: He is alert.  Psychiatric:        Mood and Affect: Mood normal.        Behavior: Behavior normal.     Lab Results  Component Value Date   WBC 7.1 05/03/2024   HGB 14.8 05/03/2024   HCT 44.3 05/03/2024   PLT 282 05/03/2024   GLUCOSE 100 (H) 05/03/2024   CHOL 157 05/03/2024   TRIG 130 05/03/2024   HDL 45 05/03/2024   LDLCALC 89 05/03/2024   ALT 48 (H) 05/03/2024   AST 36 05/03/2024   NA 141 05/03/2024   K 5.1 05/03/2024   CL 102 05/03/2024   CREATININE 1.41 (H) 05/03/2024   BUN 19 05/03/2024   CO2 21 05/03/2024   PSA 0.21 02/15/2014     Assessment & Plan:  Essential hypertension, benign Assessment & Plan: Stable.  Continue current medications.  Medications refilled.  Orders: -     Enalapril  Maleate; Take 1 tablet (20 mg total) by mouth daily.  Dispense: 90 tablet; Refill: 3 -     amLODIPine  Besylate; Take 1 tablet (10 mg total) by mouth daily.  Dispense: 90 tablet; Refill: 3  Stage 3a chronic kidney disease (HCC) Assessment & Plan: Stable.  Metabolic panel today.  Orders: -     CBC -     CMP14+EGFR -     Microalbumin / creatinine urine ratio  Pure hypercholesterolemia Assessment & Plan: Stable.  Continue Crestor .  Refilled.  Orders: -     Lipid panel  Gastroesophageal reflux disease, unspecified whether esophagitis present Assessment & Plan: Stable.  Continue Dexilant .  Orders: -     Dexlansoprazole ; Take 1 capsule (30 mg total) by mouth daily.  Dispense: 90 capsule; Refill: 3    Follow-up: 6 months  Justin Meisenheimer Bluford DO [facility] "

## 2024-11-02 NOTE — Patient Instructions (Signed)
 Continue your medications.  Follow up in 6 months.  Take care  Dr. Adriana Simas

## 2024-11-03 ENCOUNTER — Ambulatory Visit: Admitting: Family Medicine

## 2024-11-03 LAB — CMP14+EGFR
ALT: 51 [IU]/L — ABNORMAL HIGH (ref 0–44)
AST: 35 [IU]/L (ref 0–40)
Albumin: 4.6 g/dL (ref 3.9–4.9)
Alkaline Phosphatase: 74 [IU]/L (ref 47–123)
BUN/Creatinine Ratio: 14 (ref 10–24)
BUN: 18 mg/dL (ref 8–27)
Bilirubin Total: 0.3 mg/dL (ref 0.0–1.2)
CO2: 23 mmol/L (ref 20–29)
Calcium: 9.6 mg/dL (ref 8.6–10.2)
Chloride: 105 mmol/L (ref 96–106)
Creatinine, Ser: 1.33 mg/dL — ABNORMAL HIGH (ref 0.76–1.27)
Globulin, Total: 2.3 g/dL (ref 1.5–4.5)
Glucose: 110 mg/dL — ABNORMAL HIGH (ref 70–99)
Potassium: 5 mmol/L (ref 3.5–5.2)
Sodium: 141 mmol/L (ref 134–144)
Total Protein: 6.9 g/dL (ref 6.0–8.5)
eGFR: 59 mL/min/{1.73_m2} — ABNORMAL LOW

## 2024-11-03 LAB — LIPID PANEL
Chol/HDL Ratio: 3.4 ratio (ref 0.0–5.0)
Cholesterol, Total: 155 mg/dL (ref 100–199)
HDL: 45 mg/dL
LDL Chol Calc (NIH): 94 mg/dL (ref 0–99)
Triglycerides: 84 mg/dL (ref 0–149)
VLDL Cholesterol Cal: 16 mg/dL (ref 5–40)

## 2024-11-03 LAB — CBC
Hematocrit: 44.7 % (ref 37.5–51.0)
Hemoglobin: 15 g/dL (ref 13.0–17.7)
MCH: 29.1 pg (ref 26.6–33.0)
MCHC: 33.6 g/dL (ref 31.5–35.7)
MCV: 87 fL (ref 79–97)
Platelets: 242 10*3/uL (ref 150–450)
RBC: 5.16 x10E6/uL (ref 4.14–5.80)
RDW: 13.3 % (ref 11.6–15.4)
WBC: 7.1 10*3/uL (ref 3.4–10.8)

## 2024-11-03 LAB — MICROALBUMIN / CREATININE URINE RATIO
Creatinine, Urine: 249 mg/dL
Microalb/Creat Ratio: 16 mg/g{creat} (ref 0–29)
Microalbumin, Urine: 38.6 ug/mL

## 2025-05-02 ENCOUNTER — Ambulatory Visit: Admitting: Family Medicine
